# Patient Record
Sex: Female | Born: 1955 | Race: White | Hispanic: No | Marital: Single | Smoking: Former smoker
Health system: Southern US, Community
[De-identification: ages and names within clinical notes are randomized; demographics above are authoritative.]

## PROBLEM LIST (undated history)

## (undated) DIAGNOSIS — M722 Plantar fascial fibromatosis: Secondary | ICD-10-CM

## (undated) DIAGNOSIS — F329 Major depressive disorder, single episode, unspecified: Secondary | ICD-10-CM

## (undated) DIAGNOSIS — E7800 Pure hypercholesterolemia, unspecified: Secondary | ICD-10-CM

## (undated) DIAGNOSIS — M1990 Unspecified osteoarthritis, unspecified site: Secondary | ICD-10-CM

## (undated) DIAGNOSIS — F32A Depression, unspecified: Secondary | ICD-10-CM

## (undated) HISTORY — DX: Plantar fascial fibromatosis: M72.2

## (undated) HISTORY — DX: Unspecified osteoarthritis, unspecified site: M19.90

## (undated) HISTORY — DX: Depression, unspecified: F32.A

## (undated) HISTORY — DX: Major depressive disorder, single episode, unspecified: F32.9

## (undated) HISTORY — DX: Pure hypercholesterolemia, unspecified: E78.00

---

## 1995-04-09 HISTORY — PX: CERVICAL CONE BIOPSY: SUR198

## 2012-08-12 ENCOUNTER — Ambulatory Visit: Payer: No Typology Code available for payment source

## 2012-08-12 ENCOUNTER — Ambulatory Visit (INDEPENDENT_AMBULATORY_CARE_PROVIDER_SITE_OTHER): Payer: No Typology Code available for payment source | Admitting: Family Medicine

## 2012-08-12 VITALS — BP 132/86 | HR 86 | Temp 97.9°F | Resp 18 | Ht 67.5 in | Wt 183.2 lb

## 2012-08-12 DIAGNOSIS — R102 Pelvic and perineal pain: Secondary | ICD-10-CM

## 2012-08-12 DIAGNOSIS — K5900 Constipation, unspecified: Secondary | ICD-10-CM

## 2012-08-12 DIAGNOSIS — N949 Unspecified condition associated with female genital organs and menstrual cycle: Secondary | ICD-10-CM

## 2012-08-12 LAB — POCT URINALYSIS DIPSTICK
Bilirubin, UA: NEGATIVE
Blood, UA: NEGATIVE
Glucose, UA: NEGATIVE
Ketones, UA: NEGATIVE
Leukocytes, UA: NEGATIVE
Spec Grav, UA: 1.01
Urobilinogen, UA: 0.2
pH, UA: 5

## 2012-08-12 LAB — POCT UA - MICROSCOPIC ONLY
Casts, Ur, LPF, POC: NEGATIVE
Crystals, Ur, HPF, POC: NEGATIVE
Yeast, UA: NEGATIVE

## 2012-08-12 MED ORDER — CIPROFLOXACIN HCL 500 MG PO TABS: 500.0000 mg | ORAL_TABLET | Freq: Two times a day (BID) | ORAL | Status: DC

## 2012-08-12 MED ORDER — PHENAZOPYRIDINE HCL 200 MG PO TABS: 200.0000 mg | ORAL_TABLET | Freq: Three times a day (TID) | ORAL | Status: DC | PRN

## 2012-08-12 NOTE — Patient Instructions (Signed)
Get plenty of rest and drink at least 64 ounces of water daily. 

## 2012-08-12 NOTE — Progress Notes (Signed)
Subjective:    Patient ID: Lindsay Barnes, female    DOB: Sep 24, 1955, 57 y.o.   MRN: [id number]  HPI This 57 y.o. female presents for evaluation of "UTI."  Low pelvic pain x 27 hours.  Worse hotflashes than usual the last few weeks, especially bad last night.  Developed cramping, like she might have a period, but didn't bleed.  Thought perhaps it was gas, as she's had constipation x several weeks.  Pain got much worse over night, and used an OTC test for urinary tract infection, which was "very Very positive." Took an OTC azo to ease the pain, along with ibuprofen, which allowed her to sleep. UTI 11/2011 with similar symptoms, but with burning (that was the first UTI she'd had in >20 years).  Not sexually active x 3 weeks.  Small BM this morning. Has not had a colonoscopy.  [place] and has not established with PCP.  Her daughter recommended this office.  No increased urinary frequency. Some urgency last night.    Review of Systems No fever, chills, nausea, vomiting, HA, dizziness, blood in stool, melena,     Objective:   Physical Exam Blood pressure 132/86, pulse 86, temperature 97.9 F (36.6 C), temperature source Oral, resp. rate 18, height 5' 7.5" (1.715 m), weight 183 lb 3.2 oz (83.099 kg), SpO2 97.00%. Body mass index is 28.25 kg/(m^2). Well-developed, well nourished WF who is awake, alert and oriented, in NAD. HEENT: [place]/AT, sclera and conjunctiva are clear.   Neck: supple, non-tender, no lymphadenopathy, thyromegaly. Heart: RRR, no murmur Lungs: normal effort, CTA Abdomen: normo-active bowel sounds, supple, no mass or organomegaly. Tenderness in the suprapubic and LLQ. Extremities: no cyanosis, clubbing or edema. Skin: warm and dry without rash. Psychologic: good mood and appropriate affect, normal speech and behavior.  Acute Abdominal Series: [facility] reading (PRIMARY) by  Dr. Neva Seat.  Normal chest.  No free air.  Non-specific bowel gas pattern.  Results  for orders placed in visit on 08/12/12  POCT UA - MICROSCOPIC ONLY      Result Value Range   WBC, Ur, HPF, POC 1-2     RBC, urine, microscopic 0-1     Bacteria, U Microscopic small     Mucus, UA trace     Epithelial cells, urine per micros 1-3     Crystals, Ur, HPF, POC neg     Casts, Ur, LPF, POC neg     Yeast, UA neg    POCT URINALYSIS DIPSTICK      Result Value Range   Color, UA orange     Clarity, UA clear     Glucose, UA neg     Bilirubin, UA neg     Ketones, UA neg     Spec Grav, UA 1.010     Blood, UA neg     pH, UA 5.0     Protein, UA trace     Urobilinogen, UA 0.2     Nitrite, UA postive     Leukocytes, UA Negative        Assessment & Plan:  Pelvic pain in female - Plan: POCT UA - Microscopic Only, POCT urinalysis dipstick, Urine culture, DG Abd Acute W/Chest, ciprofloxacin (CIPRO) 500 MG tablet, phenazopyridine (PYRIDIUM) 200 MG tablet  Constipation - Plan: DG Abd Acute W/Chest - increase fluids, fiber and physical activity.  OTC stool softener prn.  Encouraged colonoscopy (I am happy to refer if required by her insurance).  RTC if symptoms worsen/persist.  Fernande Bras, PA-C Physician Assistant-Certified [facility]

## 2012-08-12 NOTE — Progress Notes (Signed)
Xray read and patient discussed with Ms. Leotis Shames, PA-C. Agree with assessment and plan of care per her note. Radiology report noted - acute abd series: IMPRESSION:  1. No acute cardiopulmonary process.  2. No bowel obstruction intraperitoneal free air.

## 2013-03-10 ENCOUNTER — Ambulatory Visit (INDEPENDENT_AMBULATORY_CARE_PROVIDER_SITE_OTHER): Payer: No Typology Code available for payment source | Admitting: Physician Assistant

## 2013-03-10 VITALS — BP 116/80 | HR 75 | Temp 97.4°F | Resp 16 | Ht 67.75 in | Wt 189.8 lb

## 2013-03-10 DIAGNOSIS — M79609 Pain in unspecified limb: Secondary | ICD-10-CM

## 2013-03-10 DIAGNOSIS — M549 Dorsalgia, unspecified: Secondary | ICD-10-CM

## 2013-03-10 DIAGNOSIS — L723 Sebaceous cyst: Secondary | ICD-10-CM

## 2013-03-10 DIAGNOSIS — L089 Local infection of the skin and subcutaneous tissue, unspecified: Secondary | ICD-10-CM

## 2013-03-10 MED ORDER — FLUCONAZOLE 150 MG PO TABS: 150.0000 mg | ORAL_TABLET | Freq: Once | ORAL | Status: DC

## 2013-03-10 MED ORDER — DOXYCYCLINE HYCLATE 100 MG PO CAPS: 100.0000 mg | ORAL_CAPSULE | Freq: Two times a day (BID) | ORAL | Status: DC

## 2013-03-10 NOTE — Progress Notes (Signed)
   Subjective:    Patient ID: Lindsay Barnes, female    DOB: 06/24/1955, 57 y.o.   MRN: [id number]  HPI   Lindsay Barnes is a very pleasant 57 yr old female here with concern for a knot on her back.  Reports having a spot on her back for years.  The spot was not raised but was always itchy.  About 10 days ago she noted a scab in that area.  There was a small amount of foul smelling drainage.  Now she has some pain in that spot.  Feels like a bruise.  No further drainage.  No fevers or chills.  Otherwise feels well.     Review of Systems  Constitutional: Negative for fever and chills.  Respiratory: Negative.   Cardiovascular: Negative.   Gastrointestinal: Negative.   Musculoskeletal: Negative.   Skin:       Knot on back       Objective:   Physical Exam  Vitals reviewed. Constitutional: She is oriented to person, place, and time. She appears well-developed and well-nourished. No distress.  HENT:  Head: Normocephalic and atraumatic.  Eyes: Conjunctivae are normal. No scleral icterus.  Neurological: She is alert and oriented to person, place, and time.  Skin: Skin is warm and dry.     Indurated, erythematous area about 2x3 cm at right upper back; minimal fluctuance; no warmth; appears c/w infected sebaceous cyst  Psychiatric: She has a normal mood and affect. Her behavior is normal.    Procedure Note: Verbal consent obtained from the patient.  Local anesthesia with 1cc 2% plain lidocaine.  Betadine prep.  Sterile technique.  Small incision with 11 blade.  Moderate purulence and scant sebaceous material were expressed.  Culture collected.  Irrigated with remaining anesthetic.  Packed with 1/4" plain packing.  Cleansed and dressed.      Assessment & Plan:  Infected sebaceous cyst - Plan: Wound culture, doxycycline (VIBRAMYCIN) 100 MG capsule   Lindsay Barnes is a very pleasant 57 yr old female here with infected sebaceous cyst at right upper back.  Procedure as above.  Culture collected.  Given  the drainage of purulent material, I have started her on doxycycline and packed the area today.  Expect the area to continue to drain.  Encouraged hot compresses.  Follow up in 72 hours for wound care, sooner if concerns.  Diflucan if needed for yeast infection   Meds ordered this encounter  Medications  . doxycycline (VIBRAMYCIN) 100 MG capsule    Sig: Take 1 capsule (100 mg total) by mouth 2 (two) times daily.    Dispense:  20 capsule    Refill:  0    Order Specific Question:  Supervising Provider    Answer:  Ethelda Chick [[id number]]  . fluconazole (DIFLUCAN) 150 MG tablet    Sig: Take 1 tablet (150 mg total) by mouth once. Repeat in a week if needed    Dispense:  2 tablet    Refill:  0    Order Specific Question:  Supervising Provider    Answer:  Ethelda Chick [[id number]]    Loleta Dicker MHS, PA-C [facility] 12/3/20142:52 PM

## 2013-03-10 NOTE — Patient Instructions (Signed)
Begin taking the antibiotics as directed.  Frequent hot compresses over the next 48 hours.  Change the dressing at least 1 time per day, more frequently if the dressing is getting wet or saturated.  Return on Saturday for wound care with Chelle, sooner if you have concerns   Epidermal Cyst An epidermal cyst is sometimes called a sebaceous cyst, epidermal inclusion cyst, or infundibular cyst. These cysts usually contain a substance that looks "pasty" or "cheesy" and may have a bad smell. This substance is a protein called keratin. Epidermal cysts are usually found on the face, neck, or trunk. They may also occur in the vaginal area or other parts of the genitalia of both men and women. Epidermal cysts are usually small, painless, slow-growing bumps or lumps that move freely under the skin. It is important not to try to pop them. This may cause an infection and lead to tenderness and swelling. CAUSES  Epidermal cysts may be caused by a deep penetrating injury to the skin or a plugged hair follicle, often associated with acne. SYMPTOMS  Epidermal cysts can become inflamed and cause:  Redness.  Tenderness.  Increased temperature of the skin over the bumps or lumps.  Grayish-white, bad smelling material that drains from the bump or lump. DIAGNOSIS  Epidermal cysts are easily diagnosed by your caregiver during an exam. Rarely, a tissue sample (biopsy) may be taken to rule out other conditions that may resemble epidermal cysts. TREATMENT   Epidermal cysts often get better and disappear on their own. They are rarely ever cancerous.  If a cyst becomes infected, it may become inflamed and tender. This may require opening and draining the cyst. Treatment with antibiotics may be necessary. When the infection is gone, the cyst may be removed with minor surgery.  Small, inflamed cysts can often be treated with antibiotics or by injecting steroid medicines.  Sometimes, epidermal cysts become large and  bothersome. If this happens, surgical removal in your caregiver's office may be necessary. HOME CARE INSTRUCTIONS  Only take over-the-counter or prescription medicines as directed by your caregiver.  Take your antibiotics as directed. Finish them even if you start to feel better. SEEK MEDICAL CARE IF:   Your cyst becomes tender, red, or swollen.  Your condition is not improving or is getting worse.  You have any other questions or concerns. MAKE SURE YOU:  Understand these instructions.  Will watch your condition.  Will get help right away if you are not doing well or get worse. Document Released: 02/24/2004 Document Revised: 06/17/2011 Document Reviewed: 10/01/2010 [facility] Patient Information 2014 [place], [place].

## 2013-03-13 ENCOUNTER — Ambulatory Visit (INDEPENDENT_AMBULATORY_CARE_PROVIDER_SITE_OTHER): Payer: No Typology Code available for payment source | Admitting: Physician Assistant

## 2013-03-13 VITALS — BP 104/70 | HR 76 | Temp 97.5°F | Resp 16 | Ht 67.5 in | Wt 189.0 lb

## 2013-03-13 DIAGNOSIS — L723 Sebaceous cyst: Secondary | ICD-10-CM

## 2013-03-13 DIAGNOSIS — L089 Local infection of the skin and subcutaneous tissue, unspecified: Secondary | ICD-10-CM

## 2013-03-13 LAB — WOUND CULTURE
Gram Stain: NONE SEEN
Gram Stain: NONE SEEN

## 2013-03-13 NOTE — Progress Notes (Signed)
   Subjective:    Patient ID: Lindsay Barnes, female    DOB: 06-Oct-1955, 57 y.o.   MRN: [id number]  Chief Complaint  Patient presents with  . Wound Care    Infected sebaceous cyst on right mid back    HPI Presents for wound care, s/p I&D of an infected sebaceous cyst on the back 3 days ago. That note is reviewed.  She is tolerating the doxycycline well. No pain at the site, minimal drainage. No fever, chills. Swelling much improved. She notes that the packing came out during a dressing change on the day after I&D.   Review of Systems     Objective:   Physical Exam BP 104/70  Pulse 76  Temp(Src) 97.5 F (36.4 C) (Oral)  Resp 16  Ht 5' 7.5" (1.715 m)  Wt 189 lb (85.73 kg)  BMI 29.15 kg/m2  SpO2 98% WDWNWF, A&O x 3. Bandage removed.  Well-healed surgical wound noted.  No erythema or edema.  Minimal induration. Non-fluctuant. Non-tender.       Assessment & Plan:  1. Infected sebaceous cyst Anticipatory guidance provided.  RTC PRN.   Fernande Bras, PA-C Physician Assistant-Certified [facility]

## 2013-07-15 ENCOUNTER — Ambulatory Visit (INDEPENDENT_AMBULATORY_CARE_PROVIDER_SITE_OTHER): Payer: No Typology Code available for payment source | Admitting: Physician Assistant

## 2013-07-15 VITALS — BP 110/70 | HR 90 | Temp 98.6°F | Resp 16 | Ht 68.5 in | Wt 187.0 lb

## 2013-07-15 DIAGNOSIS — J329 Chronic sinusitis, unspecified: Secondary | ICD-10-CM

## 2013-07-15 MED ORDER — AMOXICILLIN 875 MG PO TABS: 1750.0000 mg | ORAL_TABLET | Freq: Two times a day (BID) | ORAL | Status: DC

## 2013-07-15 MED ORDER — IPRATROPIUM BROMIDE 0.03 % NA SOLN: 2.0000 | Freq: Two times a day (BID) | NASAL | Status: DC

## 2013-07-15 MED ORDER — FLUTICASONE PROPIONATE 50 MCG/ACT NA SUSP: 2.0000 | Freq: Every day | NASAL | Status: DC

## 2013-07-15 NOTE — Progress Notes (Signed)
   Subjective:    Patient ID: Lindsay Barnes, female    DOB: 08/25/1955, 58 y.o.   MRN: [id number]  Sinus Problem Associated symptoms include congestion and a sore throat. Pertinent negatives include no chills or diaphoresis.  Sore Throat  Associated symptoms include congestion.    57y.o. Otherwise healthy female presents with 6 days of sinus pressure, post nasal drip, and sore throat.  She mentions that she has had these symptoms almost every year around this time.  She has bumped up her Vitamin C dose and has tried Copy[employer], advertising in past 2 days with no relief.  Pt feels like she has had associated generalized body aches, mild cough, decreased appetite, and fever but none measured.  Denies chills, nausea, vomiting, diarrhea, SOB, cough, abd pain.  No flu shot.  Review of Systems  Constitutional: Positive for fever (subjective; not measured). Negative for chills, diaphoresis, fatigue and unexpected weight change.  HENT: Positive for congestion and sore throat.   Respiratory: Negative.   Cardiovascular: Negative.   Gastrointestinal: Negative.   Endocrine: Negative.   Genitourinary: Negative.   Musculoskeletal: Positive for myalgias (generalized body aches).  Skin: Negative for rash.  Neurological: Negative.        Objective:   Physical Exam  Constitutional: She is oriented to person, place, and time. She appears well-developed and well-nourished. No distress.  HENT:  Head: Normocephalic.  Left Ear: External ear normal.  Mouth/Throat: No oropharyngeal exudate.  Mildly erythmatous retropharynx  Cardiovascular: Normal rate, regular rhythm, normal heart sounds and intact distal pulses.  Exam reveals no gallop and no friction rub.   No murmur heard. Pulmonary/Chest: Effort normal and breath sounds normal. No respiratory distress. She has no wheezes. She has no rales.  Abdominal: Soft. Bowel sounds are normal. There is no tenderness.  Lymphadenopathy:    Cervical adenopathy: tender in  tonsillar node region but no palpable lymphadenopathy.  Neurological: She is alert and oriented to person, place, and time. No cranial nerve deficit.  Skin: No rash noted.          Assessment & Plan:   1. Sinusitis Probable sinusitis from common cold.  Told pt to try Atrovent nasal spray and supportive care.  Pt will fill amoxicillin prescription if symptoms worsen in next 2-3 days.   - fluticasone (FLONASE) 50 MCG/ACT nasal spray; Place 2 sprays into both nostrils daily.  Dispense: 16 g; Refill: 12 - ipratropium (ATROVENT) 0.03 % nasal spray; Place 2 sprays into both nostrils 2 (two) times daily.  Dispense: 30 mL; Refill: 0 - amoxicillin (AMOXIL) 875 MG tablet; Take 2 tablets (1,750 mg total) by mouth 2 (two) times daily.  Dispense: 20 tablet; Refill: 0

## 2013-07-15 NOTE — Patient Instructions (Signed)
Get plenty of rest and drink at least 64oz of water each day.  You may not need the antibiotic, and we recommend waiting 2-3 days before filling it. Flonase is recommended for maintenance treatment of allergies rather than "as needed" acute treatment.  You will likely find better acute treatment with the ipratropium nasal spray.  Using flonase daily during your allergy season may reduce your daily symptoms and reduce the likelihood of developing a sinus infection.

## 2013-07-15 NOTE — Progress Notes (Signed)
I have examined this patient along with the student and agree.  

## 2013-09-30 ENCOUNTER — Ambulatory Visit (INDEPENDENT_AMBULATORY_CARE_PROVIDER_SITE_OTHER): Payer: No Typology Code available for payment source | Admitting: Physician Assistant

## 2013-09-30 VITALS — BP 136/88 | HR 88 | Temp 98.1°F | Resp 16 | Ht 67.5 in | Wt 177.4 lb

## 2013-09-30 DIAGNOSIS — G500 Trigeminal neuralgia: Secondary | ICD-10-CM

## 2013-09-30 DIAGNOSIS — M722 Plantar fascial fibromatosis: Secondary | ICD-10-CM | POA: Insufficient documentation

## 2013-09-30 MED ORDER — BACLOFEN 20 MG PO TABS: 10.0000 mg | ORAL_TABLET | Freq: Three times a day (TID) | ORAL | Status: DC

## 2013-09-30 NOTE — Progress Notes (Signed)
Subjective:    Patient ID: Lindsay Barnes, female    DOB: [birth year], 58 y.o.   MRN: [id number]   PCP: No PCP Per Patient  Chief Complaint  Patient presents with  . Follow-up    wanted to discuss with Chelle about her pain in her left facial/jaw pain.   also having a spasm in her right thigh.      Medications, allergies, past medical history, surgical history, family history, social history and problem list reviewed and updated.  There are no active problems to display for this patient.   Prior to Admission medications   Medication Sig Start Date End Date Taking? Authorizing Provider  cholecalciferol (VITAMIN D) 1000 UNITS tablet Take 1,000 Units by mouth daily.   Yes Historical Provider, MD  fish oil-omega-3 fatty acids 1000 MG capsule Take 2 g by mouth daily.   Yes Historical Provider, MD  Multiple Vitamins-Minerals (MULTIVITAMIN WITH MINERALS) tablet Take 1 tablet by mouth daily.   Yes Historical Provider, MD  Specialty Vitamins Products (MAGNESIUM, AMINO ACID CHELATE,) 133 MG tablet Take 1 tablet by mouth 2 (two) times daily.   Yes Historical Provider, MD    HPI  I believe I have trigeminal neuralgia.  Started about 5 years ago.  Washed her face one morning and developed spasm on the LEFT side of her face.  Saw her PCP, who considered referring her to neurology, but since the patient didn't want to take regular medications, they decided to watch it.  After 9 months, her symptoms resolved.  The symptoms recurred when she moved here almost 2 years ago.  Only with touching the LEFT side of her face.  Seemed worse than initially, but then resolved after about 3 months.  6 months later, in 11/2012, it recurred.  More intense since 03/2013, and then worse again when she had a sinusitis in 07/2013.  Attacks are severe, and then resolve completely.  For the past 4 weeks, she's had daily symptoms.   Symptoms with brushing teeth, problems chewing. Does some mediation to relax when symptoms  occur.   Also has spasms in the RIGHT thigh intermittently, since 03/2013.  No HA, dizziness, confusion or weakness. No skin lesions. No vision change.  Review of Systems     Objective:   Physical Exam  Constitutional: She is oriented to person, place, and time. She appears well-developed and well-nourished. She is active and cooperative. No distress.  BP 136/88  Pulse 88  Temp(Src) 98.1 F (36.7 C) (Oral)  Resp 16  Ht 5' 7.5" (1.715 m)  Wt 177 lb 6.4 oz (80.468 kg)  BMI 27.36 kg/m2  SpO2 98%   HENT:  Head: Normocephalic and atraumatic.  Right Ear: External ear normal.  Left Ear: External ear normal.  Nose: Nose normal.  Mouth/Throat: Oropharynx is clear and moist. No oropharyngeal exudate.  Eyes: Conjunctivae and EOM are normal. Pupils are equal, round, and reactive to light. No scleral icterus.  Neck: Neck supple. No thyromegaly present.  Cardiovascular: Normal rate, regular rhythm and normal heart sounds.   Pulmonary/Chest: Effort normal and breath sounds normal.  Lymphadenopathy:    She has no cervical adenopathy.  Neurological: She is alert and oriented to person, place, and time. She has normal strength. No cranial nerve deficit or sensory deficit.  Skin: Skin is warm and dry.  Psychiatric: She has a normal mood and affect. Her behavior is normal.          Assessment & Plan:  1. Trigeminal neuralgia  of left side of face Recommend referral to neurology, but she prefers to try Baclofen first.  We reviewed the potential adverse effects, and she'll start with 10 mg at HS, increasing as tolerated up to 20 mg TID. If she decides she's ready to go to neurology, she'll let me know. - baclofen (LIORESAL) 20 MG tablet; Take 0.5-1 tablets (10-20 mg total) by mouth 3 (three) times daily.  Dispense: 90 each; Refill: 0   Fara Chute, PA-C Physician Assistant-Certified [facility]

## 2013-09-30 NOTE — Patient Instructions (Signed)
Let me know if the symptoms worsen or persist, so I can refer you to a neurologist.

## 2014-02-13 ENCOUNTER — Ambulatory Visit (INDEPENDENT_AMBULATORY_CARE_PROVIDER_SITE_OTHER): Payer: No Typology Code available for payment source | Admitting: Emergency Medicine

## 2014-02-13 VITALS — BP 124/80 | HR 81 | Temp 97.9°F | Resp 16 | Ht 67.25 in | Wt 182.2 lb

## 2014-02-13 DIAGNOSIS — M5489 Other dorsalgia: Secondary | ICD-10-CM

## 2014-02-13 DIAGNOSIS — L03319 Cellulitis of trunk, unspecified: Secondary | ICD-10-CM

## 2014-02-13 DIAGNOSIS — L02219 Cutaneous abscess of trunk, unspecified: Secondary | ICD-10-CM

## 2014-02-13 MED ORDER — CEPHALEXIN 500 MG PO CAPS: 500.0000 mg | ORAL_CAPSULE | Freq: Four times a day (QID) | ORAL | Status: DC

## 2014-02-13 NOTE — Progress Notes (Signed)
[facility] [address] [place] [address] [phone]  Date:  02/13/2014   Name:  Lindsay Barnes   DOB:  03-May-1955   MRN:  [id number]  PCP:  No PCP Per Patient    Chief Complaint: Cyst   History of Present Illness:  Lindsay Barnes is a 58 y.o. very pleasant female patient who presents with the following:  Recurrent abscessed sebaceous cyst on right upper back. No fever or chills Now has recurrent and tender. No improvement with over the counter medications or other home remedies. Denies other complaint or health concern today.   Patient Active Problem List   Diagnosis Date Noted  . Plantar fasciitis, bilateral     Past Medical History  Diagnosis Date  . Plantar fasciitis, bilateral     Past Surgical History  Procedure Laterality Date  . Cesarean section      History  Substance Use Topics  . Smoking status: Former Smoker -- 1.00 packs/day for 30 years    Quit date: 04/16/2012  . Smokeless tobacco: Never Used     Comment: vapes  . Alcohol Use: 1.2 oz/week    2 Glasses of wine per week    Family History  Problem Relation Age of Onset  . Arthritis Mother   . Osteopenia Mother   . Heart disease Father     s/p 4V CABG  . Heart disease Maternal Grandmother     No Known Allergies  Medication list has been reviewed and updated.  Current Outpatient Prescriptions on File Prior to Visit  Medication Sig Dispense Refill  . baclofen (LIORESAL) 20 MG tablet Take 0.5-1 tablets (10-20 mg total) by mouth 3 (three) times daily. 90 each 0  . cholecalciferol (VITAMIN D) 1000 UNITS tablet Take 1,000 Units by mouth daily.    . fish oil-omega-3 fatty acids 1000 MG capsule Take 2 g by mouth daily.    . Multiple Vitamins-Minerals (MULTIVITAMIN WITH MINERALS) tablet Take 1 tablet by mouth daily.    Marland Kitchen Specialty Vitamins Products (MAGNESIUM, AMINO ACID CHELATE,) 133 MG tablet Take 1 tablet by mouth 2 (two) times daily.     No current facility-administered  medications on file prior to visit.    Review of Systems:  As per HPI, otherwise negative.    Physical Examination: Filed Vitals:   02/13/14 1400  BP: 124/80  Pulse: 81  Temp: 97.9 F (36.6 C)  Resp: 16   Filed Vitals:   02/13/14 1400  Height: 5' 7.25" (1.708 m)  Weight: 182 lb 3.2 oz (82.645 kg)   Body mass index is 28.33 kg/(m^2). Ideal Body Weight: Weight in (lb) to have BMI = 25: 160.5   GEN: WDWN, NAD, Non-toxic, Alert & Oriented x 3 HEENT: Atraumatic, Normocephalic.  Ears and Nose: No external deformity. EXTR: No clubbing/cyanosis/edema NEURO: Normal gait.  PSYCH: Normally interactive. Conversant. Not depressed or anxious appearing.  Calm demeanor.  SKIN:  Abscess right upper back   Assessment and Plan: Abscess I&D Keflex based on last culture  Signed,  Ellison Carwin, MD

## 2014-02-13 NOTE — Progress Notes (Signed)
Procedure: Verbal consent obtained. Skin was cleaned with alcohol. Skin was anesthetized with 3 cc 2% lido with epi. Skin was then cleaned again with betadine. A vertical 2 cm incision was made through the pore. A large amount of purulent material was expressed - no sebaceous material expressed. Cyst capsule was not identified. Pore tract was removed. Dark brown spongy material removed from base of wound cavity. Wound was aggressively packed with 1/4 inch packing. Dressing placed. Wound care discussed. She will return in 2 days for wound check.

## 2014-02-13 NOTE — Progress Notes (Signed)
I actively participated during the procedure.

## 2014-02-13 NOTE — Patient Instructions (Signed)
Dressing Change °A dressing is a material placed over wounds. It keeps the wound clean, dry, and protected from further injury. This provides an environment that favors wound healing.  °BEFORE YOU BEGIN °· Get your supplies together. Things you may need include: °¨ Saline solution. °¨ Flexible gauze dressing. °¨ Medicated cream. °¨ Tape. °¨ Gloves. °¨ Abdominal dressing pads. °¨ Gauze squares. °¨ Plastic bags. °· Take pain medicine 30 minutes before the dressing change if you need it. °· Take a shower before you do the first dressing change of the day. Use plastic wrap or a plastic bag to prevent the dressing from getting wet. °REMOVING YOUR OLD DRESSING  °· Wash your hands with soap and water. Dry your hands with a clean towel. °· Put on your gloves. °· Remove any tape. °· Carefully remove the old dressing. If the dressing sticks, you may dampen it with warm water to loosen it, or follow your caregiver's specific directions. °· Remove any gauze or packing tape that is in your wound. °· Take off your gloves. °· Put the gloves, tape, gauze, or any packing tape into a plastic bag. °CHANGING YOUR DRESSING °· Open the supplies. °· Take the cap off the saline solution. °· Open the gauze package so that the gauze remains on the inside of the package. °· Put on your gloves. °· Clean your wound as told by your caregiver. °· If you have been told to keep your wound dry, follow those instructions. °· Your caregiver may tell you to do one or more of the following: °¨ Pick up the gauze. Pour the saline solution over the gauze. Squeeze out the extra saline solution. °¨ Put medicated cream or other medicine on your wound if you have been told to do so. °¨ Put the solution soaked gauze only in your wound, not on the skin around it. °¨ Pack your wound loosely or as told by your caregiver. °¨ Put dry gauze on your wound. °¨ Put abdominal dressing pads over the dry gauze if your wet gauze soaks through. °· Tape the abdominal dressing  pads in place so they will not fall off. Do not wrap the tape completely around the affected part (arm, leg, abdomen). °· Wrap the dressing pads with a flexible gauze dressing to secure it in place. °· Take off your gloves. Put them in the plastic bag with the old dressing. Tie the bag shut and throw it away. °· Keep the dressing clean and dry until your next dressing change. °· Wash your hands. °SEEK MEDICAL CARE IF: °· Your skin around the wound looks red. °· Your wound feels more tender or sore. °· You see pus in the wound. °· Your wound smells bad. °· You have a fever. °· Your skin around the wound has a rash that itches and burns. °· You see black or yellow skin in your wound that was not there before. °· You feel nauseous, throw up, and feel very tired. °Document Released: 05/02/2004 Document Revised: 06/17/2011 Document Reviewed: 02/04/2011 °ExitCare® Patient Information ©2015 [employer]. This information is not intended to replace advice given to you by your health care provider. Make sure you discuss any questions you have with your health care provider. ° °

## 2014-02-15 ENCOUNTER — Ambulatory Visit (INDEPENDENT_AMBULATORY_CARE_PROVIDER_SITE_OTHER): Payer: No Typology Code available for payment source | Admitting: Physician Assistant

## 2014-02-15 VITALS — BP 118/82 | HR 94 | Temp 98.2°F | Resp 16 | Wt 183.0 lb

## 2014-02-15 DIAGNOSIS — L03319 Cellulitis of trunk, unspecified: Secondary | ICD-10-CM

## 2014-02-15 DIAGNOSIS — L02219 Cutaneous abscess of trunk, unspecified: Secondary | ICD-10-CM

## 2014-02-15 NOTE — Progress Notes (Signed)
   Subjective:    Patient ID: Lindsay Barnes, female    DOB: 02/12/1956, 58 y.o.   MRN: [id number]  HPI Pt presents for a recheck.  She is doing better. Still having some pain.  Started her abx this am and not having any problems with it. She has had some bloody drainage from the area.  Review of Systems  Constitutional: Negative for fever and chills.  Skin: Positive for wound.       Objective:   Physical Exam  Constitutional: She is oriented to person, place, and time. She appears well-developed and well-nourished.  BP 118/82 mmHg  Pulse 94  Temp(Src) 98.2 F (36.8 C)  Resp 16  Wt 183 lb (83.008 kg)  SpO2 99%   Neurological: She is alert and oriented to person, place, and time.  Skin: Skin is warm and dry.  Drsg and packing removed.  No purulence expressed.  Wound cavity appears with granulation tissue except at superior aspect where there is hard tissue almost like scar tissue that is slightly debrided. Area irrigated and repacked with 1/4in plain packing.  Drsg placed.  Psychiatric: She has a normal mood and affect. Her behavior is normal. Judgment and thought content normal.  Vitals reviewed.      Assessment & Plan:  Cellulitis and abscess of trunk   Continue abx.  Change drsg daily or sooner if soaked.  F/u in [place] PA-C  [facility] 02/15/2014 7:03 PM

## 2014-02-17 ENCOUNTER — Ambulatory Visit (INDEPENDENT_AMBULATORY_CARE_PROVIDER_SITE_OTHER): Payer: No Typology Code available for payment source | Admitting: Physician Assistant

## 2014-02-17 VITALS — BP 116/74 | HR 83 | Temp 98.0°F | Resp 20 | Wt 183.0 lb

## 2014-02-17 DIAGNOSIS — L03319 Cellulitis of trunk, unspecified: Secondary | ICD-10-CM

## 2014-02-17 DIAGNOSIS — L02219 Cutaneous abscess of trunk, unspecified: Secondary | ICD-10-CM

## 2014-02-17 NOTE — Progress Notes (Signed)
   Subjective:    Patient ID: Lindsay Barnes, female    DOB: Oct 25, 1955, 58 y.o.   MRN: [id number]  HPI Pt presents to clinic for wound recheck.  She feels like the area is better.  She is tolerating the abx ok.  She is changing the drsg daily.  Review of Systems  Constitutional: Negative for fever and chills.  Skin: Positive for wound.       Objective:   Physical Exam  Constitutional: She is oriented to person, place, and time. She appears well-developed and well-nourished.  BP 116/74 mmHg  Pulse 83  Temp(Src) 98 F (36.7 C) (Oral)  Resp 20  Wt 183 lb (83.008 kg)  SpO2 97%   HENT:  Head: Normocephalic and atraumatic.  Right Ear: External ear normal.  Left Ear: External ear normal.  Pulmonary/Chest: Effort normal.  Neurological: She is alert and oriented to person, place, and time.  Skin: Skin is warm and dry.  Drsg and packing removed.  Wound cavity has good granulation tissue.  No more spongy material is present in the wound cavity.  Induration is almost resolved and there is no erythema.    Psychiatric: She has a normal mood and affect. Her behavior is normal. Judgment and thought content normal.  Vitals reviewed.      Assessment & Plan:  Cellulitis and abscess of trunk  Finish abx.  Change drsg daily until the wound is healed in.  No packing placed today.  Windell Hummingbird PA-C  [facility] 02/17/2014 9:03 PM

## 2014-08-27 ENCOUNTER — Ambulatory Visit (INDEPENDENT_AMBULATORY_CARE_PROVIDER_SITE_OTHER): Payer: 59 | Admitting: Physician Assistant

## 2014-08-27 VITALS — BP 120/78 | HR 76 | Temp 98.2°F | Ht 67.25 in | Wt 188.1 lb

## 2014-08-27 DIAGNOSIS — L03312 Cellulitis of back [any part except buttock]: Secondary | ICD-10-CM

## 2014-08-27 MED ORDER — FLUCONAZOLE 150 MG PO TABS: 150.0000 mg | ORAL_TABLET | Freq: Once | ORAL | Status: DC

## 2014-08-27 MED ORDER — CEPHALEXIN 500 MG PO CAPS
500.0000 mg | ORAL_CAPSULE | Freq: Three times a day (TID) | ORAL | Status: AC
Start: 2014-08-27 — End: 2014-09-06

## 2014-08-27 NOTE — Progress Notes (Signed)
   Subjective:    Patient ID: Lindsay Barnes, female    DOB: 1955/10/22, 59 y.o.   MRN: [id number]  HPI The patient presents to clinic with an area on her back in the exact spot where she had an I&D about 6 months ago.  She noticed a swollen area that had a slight odor a few weeks ago and now it is getting red and slightly painful.  She would like to try abx 1st if that is a possibility.  Patient Active Problem List   Diagnosis Date Noted  . Plantar fasciitis, bilateral    The patient has a current medication list which includes the following prescription(s): cholecalciferol, fish oil-omega-3 fatty acids, multivitamin with minerals, magnesium (amino acid chelate), cephalexin, and fluconazole. No Known Allergies  Medications, allergies, past medical history, surgical history, family history, social history and problem list reviewed and updated.   Review of Systems  Constitutional: Negative for fever and chills.  Skin: Positive for wound.       Objective:   Physical Exam  Constitutional: She is oriented to person, place, and time. She appears well-developed and well-nourished.  BP 120/78 mmHg  Pulse 76  Temp(Src) 98.2 F (36.8 C) (Oral)  Ht 5' 7.25" (1.708 m)  Wt 188 lb 2 oz (85.333 kg)  BMI 29.25 kg/m2  SpO2 98%   HENT:  Head: Normocephalic and atraumatic.  Right Ear: External ear normal.  Left Ear: External ear normal.  Eyes: Conjunctivae are normal.  Pulmonary/Chest: Effort normal.  Neurological: She is alert and oriented to person, place, and time.  Skin: Skin is warm and dry. There is erythema (mid back erythema with central mild induration with a central pore.  Nothing expressed out of the area.  No fluctuence noted on exam. slight TTP).  Psychiatric: She has a normal mood and affect. Her behavior is normal. Judgment and thought content normal.       Assessment & Plan:  Cellulitis of back - Plan: cephALEXin (KEFLEX) 500 MG capsule, fluconazole (DIFLUCAN) 150 MG tablet     We will start abx and she will do warm compresses and if the area does not improve she will return to clinic for an I&D.  Windell Hummingbird PA-C  [facility] 08/28/2014 8:07 AM

## 2014-08-27 NOTE — Patient Instructions (Signed)
Warm compresses.

## 2014-09-18 ENCOUNTER — Ambulatory Visit (INDEPENDENT_AMBULATORY_CARE_PROVIDER_SITE_OTHER): Payer: 59 | Admitting: Physician Assistant

## 2014-09-18 DIAGNOSIS — L03319 Cellulitis of trunk, unspecified: Secondary | ICD-10-CM | POA: Diagnosis not present

## 2014-09-18 DIAGNOSIS — L02219 Cutaneous abscess of trunk, unspecified: Secondary | ICD-10-CM

## 2014-09-18 MED ORDER — SULFAMETHOXAZOLE-TRIMETHOPRIM 800-160 MG PO TABS: 1.0000 | ORAL_TABLET | Freq: Two times a day (BID) | ORAL | Status: AC

## 2014-09-18 NOTE — Progress Notes (Signed)
Patient ID: Lindsay Barnes, female    DOB: 1955-09-16, 59 y.o.   MRN: [id number]  PCP: No PCP Per Patient   Subjective:  HPI Pt is a 59 y/o female that presents to clinic for evaluation of a recurrent abscess.   Pt has been seen 3 times (this being her 4th) since December 2014 for a recurrent abscess in the middle of her upper back. It was last drained on 02/15/2014, cultures grew out staph aureus at this time, and it recurred in May of 2016. When seen on 08/27/2014, pt refused drainage and elected to use antibiotics alone for treatment.  Pt completed her 10-day course of keflex on 09/06/14, which she tolerated well. The Keflex did not result in much improvement, however, the abscess grew significantly after she completed the antibiotic course.  Pt states that the abscess is not very tender, but is occasionally itchy, and is currently not draining. Pt denies any fever/chills or nausea/vomiting.    Review of Systems  Constitutional: Positive for fatigue. Negative for fever, chills and appetite change.  HENT: Positive for sneezing (Associated with dust/dirt and fumes at her new job). Negative for congestion, ear discharge, postnasal drip, rhinorrhea, sinus pressure and sore throat.   Eyes: Negative for photophobia, discharge, redness and itching.  Respiratory: Positive for cough (Associated with dust/dirt and fumes at her new job). Negative for choking, chest tightness, shortness of breath, wheezing and stridor.   Cardiovascular: Negative.   Gastrointestinal: Negative for nausea, vomiting, diarrhea and constipation.  Musculoskeletal: Negative for myalgias and arthralgias.  Skin: Positive for wound (Abscess on upper back). Negative for rash.  Allergic/Immunologic: Negative for environmental allergies and food allergies.  Hematological: Negative for adenopathy.  Psychiatric/Behavioral: Positive for suicidal ideas (thoughts, but no plan), dysphoric mood and decreased concentration. Negative for sleep  disturbance, self-injury and agitation. The patient is not nervous/anxious and is not hyperactive.      Patient Active Problem List   Diagnosis Date Noted  . Plantar fasciitis, bilateral     Past Medical History  Diagnosis Date  . Plantar fasciitis, bilateral     Prior to Admission medications   Medication Sig Start Date End Date Taking? Authorizing Provider  cholecalciferol (VITAMIN D) 1000 UNITS tablet Take 1,000 Units by mouth daily.   Yes Historical Provider, MD  fish oil-omega-3 fatty acids 1000 MG capsule Take 2 g by mouth daily.   Yes Historical Provider, MD  Multiple Vitamins-Minerals (MULTIVITAMIN WITH MINERALS) tablet Take 1 tablet by mouth daily.   Yes Historical Provider, MD  Specialty Vitamins Products (MAGNESIUM, AMINO ACID CHELATE,) 133 MG tablet Take 1 tablet by mouth 2 (two) times daily.   Yes Historical Provider, MD  fluconazole (DIFLUCAN) 150 MG tablet Take 1 tablet (150 mg total) by mouth once. Repeat in 1 week is needed Patient not taking: Reported on 09/18/2014 08/27/14   Mancel Bale, PA-C    No Known Allergies  Past Medical, Surgical Family and Social History reviewed and updated.   Objective:   Vitals: There were no vitals taken for this visit.   Physical Exam  Constitutional: She is oriented to person, place, and time. She appears well-developed and well-nourished. She is cooperative. No distress.  HENT:  Head: Normocephalic and atraumatic.  Right Ear: External ear normal.  Left Ear: External ear normal.  Nose: Nose normal. Right sinus exhibits no maxillary sinus tenderness and no frontal sinus tenderness. Left sinus exhibits no maxillary sinus tenderness and no frontal sinus tenderness.  Mouth/Throat: Uvula is  midline, oropharynx is clear and moist and mucous membranes are normal.  Eyes: Conjunctivae and lids are normal. Pupils are equal, round, and reactive to light. Right eye exhibits no discharge. Left eye exhibits no discharge.  Cardiovascular:  Normal rate, regular rhythm and normal heart sounds.  Exam reveals no gallop and no friction rub.   No murmur heard. Pulmonary/Chest: Effort normal and breath sounds normal. No respiratory distress. She has no wheezes. She has no rales.  Abdominal: Normal appearance.  Lymphadenopathy:       Head (right side): No submental, no submandibular, no tonsillar, no preauricular, no posterior auricular and no occipital adenopathy present.       Head (left side): No submental, no submandibular, no tonsillar, no preauricular, no posterior auricular and no occipital adenopathy present.    She has no cervical adenopathy.  Neurological: She is alert and oriented to person, place, and time.  Skin: Skin is warm, dry and intact. Lesion (1cm x 1cm closed abscess in the middle of upper back) noted. No abrasion and no rash noted. There is erythema (mild erythema directly around lesion).     Psychiatric: Her mood appears not anxious. Her affect is blunt. Her affect is not angry and not inappropriate. She exhibits a depressed mood.  Pt tearful throughout interaction.   Procedure: -  Consent obtained. -  Alcohol prep with 7 cc of lidocaine with epi injected into and around wound for local anesthesia. -  Betadine prep to skin. -  Began with 2cm incision over scar of previous I&D.  -  Copious purulent drainage initially, necrotic tissue visualized at the base of the cavity. -  3cm x 1cm elliptical incision made surrounding the initial incision with a scalpel for exploration and debridement of the area. -  Curette used to debride additional necrotic tissue at the base of the cavity. -  Wound irrigated with 36mL of NS. -  1 deep dermal suture placed, using 4-0 braided vicryl suture material, in the middle of the wound, where the wound was deepest. -  5 vertical mattress sutures placed, using 4-0 monocryl suture material.  -  Dressing applied to cover area.   Assessment & Plan:   Lindsay Barnes was seen today for  evaluation of recurrent skin abscess on her back.  Diagnoses and all orders for this visit:  Cellulitis and abscess of trunk -     Incision and debridement performed to drain abscess and remove necrotic tissue. -     3cm x 1cm wound sutured deeply and superficially try to prevent recurrence of abscess. -     Dressing applied to wound. -    Pt to finish 10 day course of antibiotics and return to clinic for re-evaluation and suture removal in 10 days. -     Return sooner if symptoms worsen. Orders: -     sulfamethoxazole-trimethoprim (BACTRIM DS,SEPTRA DS) 800-160 MG per tablet; Take 1 tablet by mouth 2 (two) times daily.  Evona Westra, PA-S [facility] 09/18/2014 3:41 PM

## 2014-09-18 NOTE — Progress Notes (Signed)
Lindsay Barnes  MRN: [id number] DOB: 12/05/1955  Subjective:  Pt presents to clinic with the area on her back getting bigger and more painful over the last several days since she stopped her abx that was started on 5/21.  She has been seen 3 times (this being her 4th) since December 2014 for a recurrent abscess in the middle of her upper back. It was last drained on 02/15/2014, cultures grew out staph aureus at this time, and it recurred in May of 2016. When seen on 08/27/2014, pt refused drainage and elected to use antibiotics alone for treatment. Pt completed her 10-day course of keflex on 09/06/14, which she tolerated well. The Keflex did not result in much improvement, however, the abscess grew significantly after she completed the antibiotic course. Pt states that the abscess is not very tender, but is occasionally itchy, and is currently not draining. Pt denies any fever/chills or nausea/vomiting.   Pt had a positive PHQ9.  She has h/o depressive episodes multiple times in the - she has had medications in the past but she has never really felt like they worked or they gave her SE that she was not willing to tolerate.  She seems to have these depression episodes associated with stressful times in her life.  She is current living with her daughter, granddaughter and her daughters boyfriend.  She feels like she is treated like a child.  She does not like her boss.  She cannot afford to live by herself.  She thinks her health insurance is not active.  She cries all the time and thinks of suicide but has no plans or intent.  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  She has seen therapy in the past but does not want it currently.  She feels like it has helped in the past though.  She feels like her depressed mood will resolve if she can get her hormones  under control.  Patient Active Problem List   Diagnosis Date Noted  . Plantar fasciitis, bilateral     Current Outpatient Prescriptions on File Prior to Visit  Medication Sig Dispense Refill  . cholecalciferol (VITAMIN D) 1000 UNITS tablet Take 1,000 Units by mouth daily.    . fish oil-omega-3 fatty acids 1000 MG capsule Take 2 g by mouth daily.    . Multiple Vitamins-Minerals (MULTIVITAMIN WITH MINERALS) tablet Take 1 tablet by mouth daily.    Marland Kitchen Specialty Vitamins Products (MAGNESIUM, AMINO ACID CHELATE,) 133 MG tablet Take 1 tablet by mouth 2 (two) times daily.    . fluconazole (DIFLUCAN) 150 MG tablet Take 1 tablet (150 mg total) by mouth once. Repeat in 1 week is needed (Patient not taking: Reported on 09/18/2014) 2 tablet 0   No current facility-administered medications on file prior to visit.    No Known Allergies  Review of Systems  Constitutional: Negative for fever and chills.  Skin: Positive for wound.  Psychiatric/Behavioral: Positive for suicidal ideas (no plans on intent) and dysphoric mood. Negative for sleep disturbance.   Objective:  There were no vitals taken for this visit.  Physical Exam  Constitutional: She is oriented to person, place, and time and well-developed, well-nourished, and in no distress.  HENT:  Head: Normocephalic and atraumatic.  Right Ear: Hearing and external ear normal.  Left Ear: Hearing and external  ear normal.  Eyes: Conjunctivae are normal.  Neck: Normal range of motion.  Pulmonary/Chest: Effort normal.  Neurological: She is alert and oriented to person, place, and time. Gait normal.  Skin: Skin is warm and dry.  2 cm abscess on right upper back - procedure completed to remove the pus and necrotic tissue present at the base of the wound cavity.  Psychiatric: Memory, affect and judgment normal.  Tearful when talking about her mood  Vitals reviewed.  Procedure:  I actively participated during the procedure and was present during the  entire procedure done by [employer] PA-S. Assessment and Plan :  Cellulitis and abscess of trunk - Plan: sulfamethoxazole-trimethoprim (BACTRIM DS,SEPTRA DS) 800-160 MG per tablet - this infection is in the same location as the abscess that I I&D'd in 02/2015.  There was the same necrotic type tissue present - unsure of the exact cause but feel like this tissue is the cause of her recurrent infection.  Hopefully the debrided area will heal and she will not have problems with this in the future.  Wound care was d/w pt.  She will RTC in 11 days for suture removal or sooner if she is having problems.  Depression - not interested in treatment at this time - she has no suicide plans or intent and plans to let me know if this changes.    She plans to get in touch with integrative medication specialist for hormone replacement and would like me to do a pap smear for her.    Lindsay Hummingbird PA-C  [facility] 09/18/2014 3:55 PM

## 2014-09-18 NOTE — Patient Instructions (Addendum)
WOUND CARE Please return in 10 (6/23 at 8am) days to have your stitches/staples removed or sooner if you have concerns. Marland Kitchen Keep area clean and dry for 24 hours. Do not remove bandage, if applied. . After 24 hours, remove bandage and wash wound gently with mild soap and warm water. Reapply a new bandage after cleaning wound, if directed. . Continue daily cleansing with soap and water until stitches/staples are removed. . Do not apply any ointments or creams to the wound while stitches/staples are in place, as this may cause delayed healing. . Notify the office if you experience any of the following signs of infection: Swelling, redness, pus drainage, streaking, fever >101.0 F . Notify the office if you experience excessive bleeding that does not stop after 15-20 minutes of constant, firm pressure.

## 2014-09-29 ENCOUNTER — Telehealth: Payer: Self-pay

## 2014-09-29 ENCOUNTER — Ambulatory Visit (INDEPENDENT_AMBULATORY_CARE_PROVIDER_SITE_OTHER): Payer: 59 | Admitting: Physician Assistant

## 2014-09-29 VITALS — BP 122/78 | HR 79 | Temp 98.2°F | Resp 17 | Ht 68.5 in | Wt 188.0 lb

## 2014-09-29 DIAGNOSIS — L089 Local infection of the skin and subcutaneous tissue, unspecified: Secondary | ICD-10-CM

## 2014-09-29 DIAGNOSIS — L723 Sebaceous cyst: Secondary | ICD-10-CM

## 2014-09-29 DIAGNOSIS — L989 Disorder of the skin and subcutaneous tissue, unspecified: Secondary | ICD-10-CM

## 2014-09-29 NOTE — Telephone Encounter (Signed)
Lindsay Barnes, pt has scheduled a CPE with you for August and she is using [place] as her PCP. Her insurance [facility]) requires a referrals for EVERYTHING. She would like to have a screening dermatology visit as she has had problems in the past. Would you be willing to put in a referral for her so we can schedule this?

## 2014-09-29 NOTE — Progress Notes (Signed)
[facility] [place], [place] [address] [phone]  Date:  09/29/2014   Name:  Lindsay Barnes   DOB:  08-01-1955   MRN:  [id number]  PCP:  No PCP Per Patient    Chief Complaint: Suture / Staple Removal   History of Present Illness:  This is a 59 y.o. female who is presenting for suture removal of an incision on her back. She underwent I&D of a recurrent abscess on her back on 09/18/14. #5 horizontal sutures placed. She reports the area is doing well. No drainage, fever or chills.  Review of Systems:  Review of Systems See HPI  Patient Active Problem List   Diagnosis Date Noted  . Plantar fasciitis, bilateral     Prior to Admission medications   Medication Sig Start Date End Date Taking? Authorizing Provider  cholecalciferol (VITAMIN D) 1000 UNITS tablet Take 1,000 Units by mouth daily.   Yes Historical Provider, MD  fish oil-omega-3 fatty acids 1000 MG capsule Take 2 g by mouth daily.   Yes Historical Provider, MD  Multiple Vitamins-Minerals (MULTIVITAMIN WITH MINERALS) tablet Take 1 tablet by mouth daily.   Yes Historical Provider, MD  Specialty Vitamins Products (MAGNESIUM, AMINO ACID CHELATE,) 133 MG tablet Take 1 tablet by mouth 2 (two) times daily.   Yes Historical Provider, MD    No Known Allergies  Past Surgical History  Procedure Laterality Date  . Cesarean section      History  Substance Use Topics  . Smoking status: Former Smoker -- 1.00 packs/day for 30 years    Quit date: 04/16/2012  . Smokeless tobacco: Never Used     Comment: vapes  . Alcohol Use: 1.2 oz/week    2 Glasses of wine per week    Family History  Problem Relation Age of Onset  . Arthritis Mother   . Osteopenia Mother   . Heart disease Father     s/p 4V CABG  . Heart disease Maternal Grandmother     Medication list has been reviewed and updated.  Physical Examination:  Physical Exam  Constitutional: She is oriented to person, place, and time. She  appears well-developed and well-nourished. No distress.  HENT:  Head: Normocephalic and atraumatic.  Right Ear: Hearing normal.  Left Ear: Hearing normal.  Nose: Nose normal.  Eyes: Conjunctivae and lids are normal. Right eye exhibits no discharge. Left eye exhibits no discharge. No scleral icterus.  Pulmonary/Chest: Effort normal. No respiratory distress.  Musculoskeletal: Normal range of motion.  Neurological: She is alert and oriented to person, place, and time.  Skin: Skin is warm, dry and intact.  #5 sutures removed from upper mid back. Incision without evidence of infection, healing well, skin intact.  Psychiatric: She has a normal mood and affect. Her speech is normal and behavior is normal. Thought content normal.   BP 122/78 mmHg  Pulse 79  Temp(Src) 98.2 F (36.8 C) (Oral)  Resp 17  Ht 5' 8.5" (1.74 m)  Wt 188 lb (85.276 kg)  BMI 28.17 kg/m2  SpO2 99%  Assessment and Plan:  1. Infected sebaceous cyst #5 sutures removed. Incision has healed well. Return with further problems.   Benjaman Pott Drenda Freeze, MHS [facility]  09/29/2014

## 2014-09-30 NOTE — Telephone Encounter (Signed)
Referrals we still may need a referral on the website.

## 2014-09-30 NOTE — Telephone Encounter (Signed)
Done

## 2014-11-23 ENCOUNTER — Encounter: Payer: 59 | Admitting: Physician Assistant

## 2014-11-29 ENCOUNTER — Encounter: Payer: Self-pay | Admitting: Physician Assistant

## 2014-11-29 ENCOUNTER — Ambulatory Visit (INDEPENDENT_AMBULATORY_CARE_PROVIDER_SITE_OTHER): Payer: 59 | Admitting: Physician Assistant

## 2014-11-29 VITALS — BP 120/76 | HR 69 | Temp 98.3°F | Resp 16 | Ht 68.0 in | Wt 187.2 lb

## 2014-11-29 DIAGNOSIS — Z1211 Encounter for screening for malignant neoplasm of colon: Secondary | ICD-10-CM | POA: Diagnosis not present

## 2014-11-29 DIAGNOSIS — Z0000 Encounter for general adult medical examination without abnormal findings: Secondary | ICD-10-CM | POA: Diagnosis not present

## 2014-11-29 DIAGNOSIS — G500 Trigeminal neuralgia: Secondary | ICD-10-CM | POA: Diagnosis not present

## 2014-11-29 DIAGNOSIS — Z13220 Encounter for screening for lipoid disorders: Secondary | ICD-10-CM | POA: Diagnosis not present

## 2014-11-29 DIAGNOSIS — Z114 Encounter for screening for human immunodeficiency virus [HIV]: Secondary | ICD-10-CM | POA: Diagnosis not present

## 2014-11-29 DIAGNOSIS — Z1159 Encounter for screening for other viral diseases: Secondary | ICD-10-CM | POA: Diagnosis not present

## 2014-11-29 DIAGNOSIS — Z23 Encounter for immunization: Secondary | ICD-10-CM

## 2014-11-29 DIAGNOSIS — Z13228 Encounter for screening for other metabolic disorders: Secondary | ICD-10-CM

## 2014-11-29 DIAGNOSIS — Z1329 Encounter for screening for other suspected endocrine disorder: Secondary | ICD-10-CM | POA: Diagnosis not present

## 2014-11-29 DIAGNOSIS — Z130 Encounter for screening for diseases of the blood and blood-forming organs and certain disorders involving the immune mechanism: Secondary | ICD-10-CM

## 2014-11-29 DIAGNOSIS — N951 Menopausal and female climacteric states: Secondary | ICD-10-CM

## 2014-11-29 DIAGNOSIS — Z124 Encounter for screening for malignant neoplasm of cervix: Secondary | ICD-10-CM | POA: Diagnosis not present

## 2014-11-29 DIAGNOSIS — Z1231 Encounter for screening mammogram for malignant neoplasm of breast: Secondary | ICD-10-CM | POA: Diagnosis not present

## 2014-11-29 DIAGNOSIS — B009 Herpesviral infection, unspecified: Secondary | ICD-10-CM

## 2014-11-29 DIAGNOSIS — N959 Unspecified menopausal and perimenopausal disorder: Secondary | ICD-10-CM

## 2014-11-29 MED ORDER — ESTRADIOL-NORETHINDRONE ACET 0.5-0.1 MG PO TABS: 1.0000 | ORAL_TABLET | Freq: Every day | ORAL | Status: AC

## 2014-11-29 MED ORDER — ESTRADIOL-NORETHINDRONE ACET 0.05-0.25 MG/DAY TD PTTW: 1.0000 | MEDICATED_PATCH | TRANSDERMAL | Status: DC

## 2014-11-29 NOTE — Progress Notes (Signed)
Patient ID: Lindsay Barnes, female    DOB: [birth year], 59 y.o.   MRN: [id number]  PCP: No PCP Per Patient  Chief Complaint  Patient presents with  . Annual Exam    w/pap    Subjective:   HPI: Presents for annual exam.  Wants to have additional thyroid screening, beyond TSH, performed.  Is interested in testing for estrogen, testosterone and progestin levels. Had contacted a "[facility]" in [place], but they never called her back. Since then, she has quit her job, so cannot afford to go there anyway.  Hot flashes have improved with reducing wine consumption. Fatigue. Always some mild vaginal itching. Recent antibiotic for MRSA infection. Did not take Diflucan, but was prescribed it. Vaginal dryness. Some irritation with bathing. Occasional burning sensation with wiping. "It just feels sensitive." No significant discharge.  Does a lot of reading about health conditions and holistic approaches to address them.  "I still get fever blisters down there, you know." For the past few months with increased stress and a viral URI illness she's had more outbreaks both on the mouth and "on my vag."  Patient Active Problem List   Diagnosis Date Noted  . Trigeminal neuralgia of left side of face 11/29/2014  . Plantar fasciitis, bilateral     Past Medical History  Diagnosis Date  . Plantar fasciitis, bilateral      Prior to Admission medications   Medication Sig Start Date End Date Taking? Authorizing Provider  cholecalciferol (VITAMIN D) 1000 UNITS tablet Take 1,000 Units by mouth daily.   Yes Historical Provider, MD  fish oil-omega-3 fatty acids 1000 MG capsule Take 2 g by mouth daily.   Yes Historical Provider, MD  Specialty Vitamins Products (MAGNESIUM, AMINO ACID CHELATE,) 133 MG tablet Take 1 tablet by mouth 2 (two) times daily.   Yes Historical Provider, MD    No Known Allergies  Past Surgical History  Procedure Laterality Date  . Cesarean section       Family History  Problem Relation Age of Onset  . Arthritis Mother   . Osteopenia Mother   . Heart disease Father     s/p 4V CABG  . Heart disease Maternal Grandmother     Social History   Social History  . Marital Status: Single    Spouse Name: n/a  . Number of Children: N/A  . Years of Education: 16   Occupational History  . retail    Social History Main Topics  . Smoking status: Former Smoker -- 1.00 packs/day for 30 years    Quit date: 04/16/2012  . Smokeless tobacco: Never Used     Comment: vapes  . Alcohol Use: 1.2 oz/week    2 Glasses of wine per week  . Drug Use: No  . Sexual Activity: Not Asked   Other Topics Concern  . None   Social History Narrative   Lives with daughter and daughter's boyfriend and their baby       Review of Systems  Constitutional: Positive for fatigue. Negative for fever, chills, diaphoresis, activity change, appetite change and unexpected weight change.  HENT: Negative.   Eyes: Negative for visual disturbance.  Respiratory: Negative.   Cardiovascular: Negative.   Gastrointestinal: Negative.   Endocrine: Positive for heat intolerance. Negative for cold intolerance, polydipsia, polyphagia and polyuria.  Genitourinary: Positive for genital sores and vaginal pain ("sensitivity"). Negative for dysuria, urgency, frequency, hematuria, flank pain, decreased urine volume, vaginal bleeding, vaginal discharge, enuresis, difficulty urinating, menstrual problem, pelvic pain  and dyspareunia.  Allergic/Immunologic: Negative.   Neurological: Negative for dizziness, weakness, light-headedness and headaches.       Intermittent Trigeminal Neuralgia pain, LEFT face  Hematological: Negative for adenopathy. Does not bruise/bleed easily.  Psychiatric/Behavioral: Negative.         Objective:  Physical Exam  Constitutional: She is oriented to person, place, and time. Vital signs are normal. She appears well-developed and well-nourished. She is  active and cooperative. No distress.  BP 120/76 mmHg  Pulse 69  Temp(Src) 98.3 F (36.8 C) (Oral)  Resp 16  Ht 5\' 8"  (1.727 m)  Wt 187 lb 3.2 oz (84.913 kg)  BMI 28.47 kg/m2  SpO2 97%   HENT:  Head: Normocephalic and atraumatic.  Right Ear: Hearing, tympanic membrane, external ear and ear canal normal. No foreign bodies.  Left Ear: Hearing, tympanic membrane, external ear and ear canal normal. No foreign bodies.  Nose: Nose normal.  Mouth/Throat: Uvula is midline, oropharynx is clear and moist and mucous membranes are normal. No oral lesions. Normal dentition. No dental abscesses or uvula swelling. No oropharyngeal exudate.  Eyes: Conjunctivae, EOM and lids are normal. Pupils are equal, round, and reactive to light. Right eye exhibits no discharge. Left eye exhibits no discharge. No scleral icterus.  Fundoscopic exam:      The right eye shows no arteriolar narrowing, no AV nicking, no exudate, no hemorrhage and no papilledema.       The left eye shows no arteriolar narrowing, no AV nicking, no exudate, no hemorrhage and no papilledema.  Neck: Trachea normal, normal range of motion and full passive range of motion without pain. Neck supple. No spinous process tenderness and no muscular tenderness present. No thyroid mass and no thyromegaly present.  Cardiovascular: Normal rate, regular rhythm, normal heart sounds, intact distal pulses and normal pulses.   Pulmonary/Chest: Effort normal and breath sounds normal. She exhibits no tenderness and no retraction. Right breast exhibits no inverted nipple, no mass, no nipple discharge, no skin change and no tenderness. Left breast exhibits no inverted nipple, no mass, no nipple discharge, no skin change and no tenderness. Breasts are symmetrical.  Abdominal: Soft. Normal appearance and bowel sounds are normal. She exhibits no distension and no mass. There is no hepatosplenomegaly. There is no tenderness. There is no rigidity, no rebound, no guarding,  no CVA tenderness, no tenderness at McBurney's point and negative Murphy's sign. No hernia. Hernia confirmed negative in the right inguinal area and confirmed negative in the left inguinal area.  Genitourinary: Rectum normal, vagina normal and uterus normal. Rectal exam shows no external hemorrhoid and no fissure. No breast swelling, tenderness, discharge or bleeding. Pelvic exam was performed with patient supine. No labial fusion. There is no rash, tenderness, lesion or injury on the right labia. There is no rash, tenderness, lesion or injury on the left labia. Cervix exhibits no motion tenderness, no discharge and no friability. Right adnexum displays no mass, no tenderness and no fullness. Left adnexum displays no mass, no tenderness and no fullness. No erythema, tenderness or bleeding in the vagina. No foreign body around the vagina. No signs of injury around the vagina. No vaginal discharge found.  Musculoskeletal: She exhibits no edema or tenderness.       Cervical back: Normal.       Thoracic back: Normal.       Lumbar back: Normal.  Lymphadenopathy:       Head (right side): No tonsillar, no preauricular, no posterior auricular and no occipital  adenopathy present.       Head (left side): No tonsillar, no preauricular, no posterior auricular and no occipital adenopathy present.    She has no cervical adenopathy.    She has no axillary adenopathy.       Right: No inguinal and no supraclavicular adenopathy present.       Left: No inguinal and no supraclavicular adenopathy present.  Neurological: She is alert and oriented to person, place, and time. She has normal strength and normal reflexes. No cranial nerve deficit. She exhibits normal muscle tone. Coordination and gait normal.  Skin: Skin is warm, dry and intact. No rash noted. She is not diaphoretic. No cyanosis or erythema. Nails show no clubbing.  Psychiatric: She has a normal mood and affect. Her speech is normal and behavior is normal.  Judgment and thought content normal.           Assessment & Plan:  1. Annual physical exam Age appropriate anticipatory guidance provided.  2. Need for influenza vaccination - Flu Vaccine QUAD 36+ mos IM  3. Need for Tdap vaccination - Tdap vaccine greater than or equal to 7yo IM  4. Screening for HIV (human immunodeficiency virus) - HIV antibody  5. Need for hepatitis C screening test - Hepatitis C antibody  6. Screening for colon cancer - Ambulatory referral to Gastroenterology  7. Encounter for screening mammogram for breast cancer - MM Digital Screening; Future  8. Screening for deficiency anemia - CBC with Differential/Platelet  9. Screening for metabolic disorder - Comprehensive metabolic panel  10. Screening for hyperlipidemia - Lipid panel  11. Screening for thyroid disorder - TSH - T3, Free - T4, Free  12. Screening for cervical cancer If both cytology and HPV are negative, repeat in 5 years. - Pap IG and HPV (high risk) DNA detection  13. Trigeminal neuralgia of left side of face Stable. Doesn't desire treatment at this time.  14. Postmenopausal disorder Trial of HRT. Consider the addition of low-dose testosterone depending on response to treatment. - Estradiol-Norethindrone Acet 0.5-0.1 MG per tablet; Take 1 tablet by mouth daily.  Dispense: 90 tablet; Refill: 3   Fara Chute, PA-C Physician Assistant-Certified [facility]

## 2014-11-29 NOTE — Patient Instructions (Signed)
I will contact you with your lab results as soon as they are available.   If you have not heard from me in 2 weeks, please contact me.  The fastest way to get your results is to register for My Chart (see the instructions on the last page of this printout).  Keeping You Healthy  Get These Tests  Blood Pressure- Have your blood pressure checked by your healthcare provider at least once a year.  Normal blood pressure is 120/80.  Weight- Have your body mass index (BMI) calculated to screen for obesity.  BMI is a measure of body fat based on height and weight.  You can calculate your own BMI at www.nhlbisupport.com/bmi/  Cholesterol- Have your cholesterol checked every year.  Diabetes- Have your blood sugar checked every year if you have high blood pressure, high cholesterol, a family history of diabetes or if you are overweight.  Pap Test - Have a pap test every 1 to 5 years if you have been sexually active.  If you are older than 65 and recent pap tests have been normal you may not need additional pap tests.  In addition, if you have had a hysterectomy  for benign disease additional pap tests are not necessary.  Mammogram-Yearly mammograms are essential for early detection of breast cancer  Screening for Colon Cancer- Colonoscopy starting at age 50. Screening may begin sooner depending on your family history and other health conditions.  Follow up colonoscopy as directed by your Gastroenterologist.  Screening for Osteoporosis- Screening begins at age 65 with bone density scanning, sooner if you are at higher risk for developing Osteoporosis.  Get these medicines  Calcium with Vitamin D- Your body requires 1200-1500 mg of Calcium a day and 800-1000 IU of Vitamin D a day.  You can only absorb 500 mg of Calcium at a time therefore Calcium must be taken in 2 or 3 separate doses throughout the day.  Hormones- Hormone therapy has been associated with increased risk for certain cancers and heart  disease.  Talk to your healthcare provider about if you need relief from menopausal symptoms.  Aspirin- Ask your healthcare provider about taking Aspirin to prevent Heart Disease and Stroke.  Get these Immuniztions  Flu shot- Every fall  Pneumonia shot- Once after the age of 65; if you are younger ask your healthcare provider if you need a pneumonia shot.  Tetanus- Every ten years.  Zostavax- Once after the age of 60 to prevent shingles.  Take these steps  Don't smoke- Your healthcare provider can help you quit. For tips on how to quit, ask your healthcare provider or go to www.smokefree.gov or call 1-800 QUIT-NOW.  Be physically active- Exercise 5 days a week for a minimum of 30 minutes.  If you are not already physically active, start slow and gradually work up to 30 minutes of moderate physical activity.  Try walking, dancing, bike riding, swimming, etc.  Eat a healthy diet- Eat a variety of healthy foods such as fruits, vegetables, whole grains, low fat milk, low fat cheeses, yogurt, lean meats, chicken, fish, eggs, dried beans, tofu, etc.  For more information go to www.thenutritionsource.org  Dental visit- Brush and floss teeth twice daily; visit your dentist twice a year.  Eye exam- Visit your Optometrist or Ophthalmologist yearly.  Drink alcohol in moderation- Limit alcohol intake to one drink or less a day.  Never drink and drive.  Depression- Your emotional health is as important as your physical health.  If you're   feeling down or losing interest in things you normally enjoy, please talk to your healthcare provider.  Seat Belts- can save your life; always wear one  Smoke/Carbon Monoxide detectors- These detectors need to be installed on the appropriate level of your home.  Replace batteries at least once a year.  Violence- If anyone is threatening or hurting you, please tell your healthcare provider.  Living Will/ Health care power of attorney- Discuss with your  healthcare provider and family. 

## 2014-11-30 ENCOUNTER — Ambulatory Visit (AMBULATORY_SURGERY_CENTER): Payer: Self-pay

## 2014-11-30 VITALS — Ht 67.5 in | Wt 186.0 lb

## 2014-11-30 DIAGNOSIS — Z1211 Encounter for screening for malignant neoplasm of colon: Secondary | ICD-10-CM

## 2014-11-30 LAB — COMPREHENSIVE METABOLIC PANEL
A/G RATIO: 1.9 (ref 1.1–2.5)
ALT: 27 IU/L (ref 0–32)
AST: 22 IU/L (ref 0–40)
Albumin: 4.5 g/dL (ref 3.5–5.5)
Alkaline Phosphatase: 85 IU/L (ref 39–117)
BILIRUBIN TOTAL: 0.3 mg/dL (ref 0.0–1.2)
BUN/Creatinine Ratio: 10 (ref 9–23)
BUN: 9 mg/dL (ref 6–24)
CALCIUM: 10 mg/dL (ref 8.7–10.2)
CHLORIDE: 100 mmol/L (ref 97–108)
CO2: 24 mmol/L (ref 18–29)
Creatinine, Ser: 0.91 mg/dL (ref 0.57–1.00)
GFR calc Af Amer: 80 mL/min/{1.73_m2} (ref >59)
GFR calc non Af Amer: 69 mL/min/{1.73_m2} (ref >59)
Globulin, Total: 2.4 g/dL (ref 1.5–4.5)
Glucose: 102 mg/dL — ABNORMAL HIGH (ref 65–99)
POTASSIUM: 4.8 mmol/L (ref 3.5–5.2)
Sodium: 140 mmol/L (ref 134–144)
Total Protein: 6.9 g/dL (ref 6.0–8.5)

## 2014-11-30 LAB — T4, FREE: FREE T4: 1.07 ng/dL (ref 0.82–1.77)

## 2014-11-30 LAB — HIV ANTIBODY (ROUTINE TESTING W REFLEX): HIV Screen 4th Generation wRfx: NONREACTIVE

## 2014-11-30 LAB — LIPID PANEL
Chol/HDL Ratio: 4.1 ratio units (ref 0.0–4.4)
Cholesterol, Total: 222 mg/dL — ABNORMAL HIGH (ref 100–199)
HDL: 54 mg/dL (ref >39)
LDL Calculated: 127 mg/dL — ABNORMAL HIGH (ref 0–99)
TRIGLYCERIDES: 203 mg/dL — AB (ref 0–149)
VLDL Cholesterol Cal: 41 mg/dL — ABNORMAL HIGH (ref 5–40)

## 2014-11-30 LAB — CBC WITH DIFFERENTIAL/PLATELET
BASOS ABS: 0 10*3/uL (ref 0.0–0.2)
Basos: 1 %
EOS (ABSOLUTE): 0.1 10*3/uL (ref 0.0–0.4)
EOS: 1 %
Hematocrit: 46.8 % — ABNORMAL HIGH (ref 34.0–46.6)
Hemoglobin: 15.9 g/dL (ref 11.1–15.9)
IMMATURE GRANULOCYTES: 0 %
Immature Grans (Abs): 0 10*3/uL (ref 0.0–0.1)
LYMPHS ABS: 2.9 10*3/uL (ref 0.7–3.1)
LYMPHS: 37 %
MCH: 29.4 pg (ref 26.6–33.0)
MCHC: 34 g/dL (ref 31.5–35.7)
MCV: 87 fL (ref 79–97)
MONOCYTES: 8 %
Monocytes Absolute: 0.6 10*3/uL (ref 0.1–0.9)
NEUTROS ABS: 4.1 10*3/uL (ref 1.4–7.0)
Neutrophils: 53 %
PLATELETS: 332 10*3/uL (ref 150–379)
RBC: 5.4 x10E6/uL — AB (ref 3.77–5.28)
RDW: 13.3 % (ref 12.3–15.4)
WBC: 7.8 10*3/uL (ref 3.4–10.8)

## 2014-11-30 LAB — T3, FREE: T3 FREE: 3.3 pg/mL (ref 2.0–4.4)

## 2014-11-30 LAB — TSH: TSH: 2.66 u[IU]/mL (ref 0.450–4.500)

## 2014-11-30 LAB — HEPATITIS C ANTIBODY: Hep C Virus Ab: <0.1 s/co ratio (ref 0.0–0.9)

## 2014-11-30 NOTE — Progress Notes (Signed)
No egg or soy allergy.  No previous complications from anesthesia. No home O2. No diet meds. 

## 2014-12-01 DIAGNOSIS — B009 Herpesviral infection, unspecified: Secondary | ICD-10-CM | POA: Insufficient documentation

## 2014-12-02 LAB — PAP IG AND HPV HIGH-RISK
HPV, high-risk: NEGATIVE
PAP Smear Comment: 0

## 2014-12-05 ENCOUNTER — Encounter: Payer: Self-pay | Admitting: Physician Assistant

## 2014-12-07 ENCOUNTER — Telehealth: Payer: Self-pay | Admitting: Physician Assistant

## 2014-12-07 NOTE — Telephone Encounter (Signed)
Ms. Boye stopped by saying Chelle was supposed to send a referral for her to have a Screening Mammogram. She's not sure of the locations we use but would like a referral sent. Please call the pt. Pt ph# [phone] Thank you.

## 2014-12-08 ENCOUNTER — Telehealth: Payer: Self-pay | Admitting: Family Medicine

## 2014-12-08 DIAGNOSIS — Z1231 Encounter for screening mammogram for malignant neoplasm of breast: Secondary | ICD-10-CM

## 2014-12-08 NOTE — Telephone Encounter (Signed)
I ordered the screening mammogram on 11/29/2014. I typically select [facility], but she can have it anywhere she prefers.   [facility] - [phone]  *ask for the Radiology Department [facility] - [phone] or [phone]  [facility] - [phone] [place] [phone] [facility] - [phone]  *ask for the [facility] - [phone]  *ask for the Radiology Department [facility] - [phone]  *ask for the [place] - [phone]

## 2014-12-08 NOTE — Telephone Encounter (Signed)
Ms. Damewood stopped by saying Lindsay Barnes was supposed to send a referral for her to have a Screening Mammogram. She's not sure of the locations we use but would like a referral sent. Please call the pt. Pt ph# [phone] Thank you.

## 2014-12-09 NOTE — Telephone Encounter (Signed)
Chelle I spoke to you about this patient I believe and I do not see a referral for a mammogram. I don't understand why I don't see it. I re-ordered mammo.

## 2014-12-14 ENCOUNTER — Encounter: Payer: Self-pay | Admitting: Internal Medicine

## 2014-12-14 ENCOUNTER — Ambulatory Visit (AMBULATORY_SURGERY_CENTER): Payer: 59 | Admitting: Internal Medicine

## 2014-12-14 VITALS — BP 117/75 | HR 67 | Temp 97.4°F | Resp 16 | Ht 67.0 in

## 2014-12-14 DIAGNOSIS — Z1211 Encounter for screening for malignant neoplasm of colon: Secondary | ICD-10-CM

## 2014-12-14 DIAGNOSIS — K635 Polyp of colon: Secondary | ICD-10-CM

## 2014-12-14 DIAGNOSIS — K621 Rectal polyp: Secondary | ICD-10-CM | POA: Diagnosis not present

## 2014-12-14 DIAGNOSIS — D125 Benign neoplasm of sigmoid colon: Secondary | ICD-10-CM | POA: Diagnosis not present

## 2014-12-14 MED ORDER — SODIUM CHLORIDE 0.9 % IV SOLN: 500.0000 mL | INTRAVENOUS | Status: DC

## 2014-12-14 NOTE — Progress Notes (Signed)
Called to room to assist during endoscopic procedure.  Patient ID and intended procedure confirmed with present staff. Received instructions for my participation in the procedure from the performing physician.  

## 2014-12-14 NOTE — Op Note (Signed)
[facility]  [address] [place], [address]   COLONOSCOPY PROCEDURE REPORT  PATIENT: Lindsay Barnes, Lindsay Barnes  MR#: [id number] BIRTHDATE: 1955/05/18 , 70  yrs. old GENDER: female ENDOSCOPIST: Jerene Bears, MD REFERRED SV:[id number] Dellis Fil[place], [place] PROCEDURE DATE:  12/14/2014 PROCEDURE:   Colonoscopy, screening and Colonoscopy with snare polypectomy First Screening Colonoscopy - Avg.  risk and is 50 yrs.  old or older Yes.  Prior Negative Screening - Now for repeat screening. N/A  History of Adenoma - Now for follow-up colonoscopy & has been > or = to 3 yrs.  N/A  Polyps removed today? Yes ASA CLASS:   Class II INDICATIONS:Screening for colonic neoplasia and average risk, 1st colonoscopy. MEDICATIONS: Monitored anesthesia care and Propofol 230 mg IV  DESCRIPTION OF PROCEDURE:   After the risks benefits and alternatives of the procedure were thoroughly explained, informed consent was obtained.  The digital rectal exam revealed no rectal mass.   The LB [id number]  endoscope was introduced through the anus and advanced to the cecum, which was identified by both the appendix and ileocecal valve. No adverse events experienced. The quality of the prep was good.  (Suprep was used)  The instrument was then slowly withdrawn as the colon was fully examined. Estimated blood loss is zero unless otherwise noted in this procedure report.   COLON FINDINGS: Three sessile polyps ranging from 5 to 109mm in size were found in the rectosigmoid colon.  Polypectomies were performed with a cold snare.  The resection was complete, the polyp tissue was completely retrieved and sent to histology.   There was severe diverticulosis noted in the ascending colon, at the hepatic flexure, and in the left colon with associated muscular hypertrophy.  Retroflexed views revealed internal hemorrhoids. The time to cecum = 4.1 Withdrawal time = 10.1   The scope was withdrawn and the procedure  completed. COMPLICATIONS: There were no immediate complications.  ENDOSCOPIC IMPRESSION: 1.   Three sessile polyps ranging from 5 to 104mm in size were found in the rectosigmoid colon; polypectomies were performed with a cold snare 2.   There was severe diverticulosis noted in the ascending colon, at the hepatic flexure, and in the left colon  RECOMMENDATIONS: 1.  Await pathology results 2.  High fiber diet 3.  Timing of repeat colonoscopy will be determined by pathology findings. 4.  You will receive a letter within 1-2 weeks with the results of your biopsy as well as final recommendations.  Please call my office if you have not received a letter after 3 weeks.  eSigned:  Jerene Bears, MD 12/14/2014 11:14 AM  cc: Daphane Shepherd, PA and The Patient

## 2014-12-14 NOTE — Patient Instructions (Signed)
YOU HAD AN ENDOSCOPIC PROCEDURE TODAY AT THE [facility]:   Refer to the procedure report that was given to you for any specific questions about what was found during the examination.  If the procedure report does not answer your questions, please call your gastroenterologist to clarify.  If you requested that your care partner not be given the details of your procedure findings, then the procedure report has been included in a sealed envelope for you to review at your convenience later.  YOU SHOULD EXPECT: Some feelings of bloating in the abdomen. Passage of more gas than usual.  Walking can help get rid of the air that was put into your GI tract during the procedure and reduce the bloating. If you had a lower endoscopy (such as a colonoscopy or flexible sigmoidoscopy) you may notice spotting of blood in your stool or on the toilet paper. If you underwent a bowel prep for your procedure, you may not have a normal bowel movement for a few days.  Please Note:  You might notice some irritation and congestion in your nose or some drainage.  This is from the oxygen used during your procedure.  There is no need for concern and it should clear up in a day or so.  SYMPTOMS TO REPORT IMMEDIATELY:   Following lower endoscopy (colonoscopy or flexible sigmoidoscopy):  Excessive amounts of blood in the stool  Significant tenderness or worsening of abdominal pains  Swelling of the abdomen that is new, acute  Fever of 100F or higher   For urgent or emergent issues, a gastroenterologist can be reached at any hour by calling [phone].   DIET: Your first meal following the procedure should be a small meal and then it is ok to progress to your normal diet. Heavy or fried foods are harder to digest and may make you feel nauseous or bloated.  Likewise, meals heavy in dairy and vegetables can increase bloating.  Drink plenty of fluids but you should avoid alcoholic beverages for 24  hours.  ACTIVITY:  You should plan to take it easy for the rest of today and you should NOT DRIVE or use heavy machinery until tomorrow (because of the sedation medicines used during the test).    FOLLOW UP: Our staff will call the number listed on your records the next business day following your procedure to check on you and address any questions or concerns that you may have regarding the information given to you following your procedure. If we do not reach you, we will leave a message.  However, if you are feeling well and you are not experiencing any problems, there is no need to return our call.  We will assume that you have returned to your regular daily activities without incident.  If any biopsies were taken you will be contacted by phone or by letter within the next 1-3 weeks.  Please call us at [phone] if you have not heard about the biopsies in 3 weeks.    SIGNATURES/CONFIDENTIALITY: You and/or your care partner have signed paperwork which will be entered into your electronic medical record.  These signatures attest to the fact that that the information above on your After Visit Summary has been reviewed and is understood.  Full responsibility of the confidentiality of this discharge information lies with you and/or your care-partner. 

## 2014-12-14 NOTE — Progress Notes (Signed)
Report to PACU, RN, vss, BBS= Clear.  

## 2014-12-15 ENCOUNTER — Telehealth: Payer: Self-pay | Admitting: Emergency Medicine

## 2014-12-15 NOTE — Telephone Encounter (Signed)
  Follow up Call-  Call back number 12/14/2014  Post procedure Call Back phone  # [phone]  Permission to leave phone message Yes     Patient questions:  Do you have a fever, pain , or abdominal swelling? No. Pain Score  0 *  Have you tolerated food without any problems? Yes.    Have you been able to return to your normal activities? Yes.    Do you have any questions about your discharge instructions: Diet   No. Medications  No. Follow up visit  No.  Do you have questions or concerns about your Care? No.  Actions: * If pain score is 4 or above: No action needed, pain <4.

## 2014-12-20 ENCOUNTER — Encounter: Payer: Self-pay | Admitting: Internal Medicine

## 2015-01-12 ENCOUNTER — Ambulatory Visit
Admission: RE | Admit: 2015-01-12 | Discharge: 2015-01-12 | Disposition: A | Discharge status: Home / Self Care | Payer: 59 | Source: Ambulatory Visit | Attending: Physician Assistant | Admitting: Physician Assistant

## 2015-01-12 ENCOUNTER — Telehealth: Payer: Self-pay | Admitting: Physician Assistant

## 2015-01-12 DIAGNOSIS — Z1231 Encounter for screening mammogram for malignant neoplasm of breast: Secondary | ICD-10-CM

## 2015-01-12 NOTE — Telephone Encounter (Signed)
Light bleeding and cramping are common side effects of using hormones, even the topical products with low levels of absorption. I am not aware of bloating associated with this treatment.  If the symptoms are too bothersome, she can certainly discontinue it. She can also try breaking it in half, and see if she still gets benefit, but without the side effects.

## 2015-01-12 NOTE — Telephone Encounter (Signed)
Patient states that since beginning the Estradiol she is experiencing light bleeding, bloating, and cramping. She states that her hot flashes have gotten better. I also gave patient the number to set up communication via MyChart as she states that she prefers to communicate through e-mail. Please call patient at [phone]

## 2015-01-13 NOTE — Telephone Encounter (Signed)
Spoke with pt, advised message from Chelle. Pt understood. 

## 2015-01-24 ENCOUNTER — Encounter: Payer: 59 | Admitting: Physician Assistant

## 2015-01-24 VITALS — BP 114/78 | HR 74 | Temp 98.0°F | Resp 18 | Ht 68.0 in | Wt 187.0 lb

## 2015-01-24 DIAGNOSIS — R350 Frequency of micturition: Secondary | ICD-10-CM

## 2015-01-24 LAB — POCT URINALYSIS DIP (MANUAL ENTRY)
BILIRUBIN UA: NEGATIVE
Glucose, UA: NEGATIVE
NITRITE UA: NEGATIVE
PH UA: 6
Protein Ur, POC: NEGATIVE
Spec Grav, UA: 1.02
UROBILINOGEN UA: 0.2

## 2015-01-24 LAB — POC MICROSCOPIC URINALYSIS (UMFC)

## 2015-01-25 ENCOUNTER — Ambulatory Visit (INDEPENDENT_AMBULATORY_CARE_PROVIDER_SITE_OTHER): Payer: 59

## 2015-01-25 ENCOUNTER — Ambulatory Visit (INDEPENDENT_AMBULATORY_CARE_PROVIDER_SITE_OTHER): Payer: 59 | Admitting: Urgent Care

## 2015-01-25 VITALS — BP 111/75 | HR 68 | Temp 97.9°F | Resp 16 | Ht 67.5 in | Wt 188.0 lb

## 2015-01-25 DIAGNOSIS — M25571 Pain in right ankle and joints of right foot: Secondary | ICD-10-CM | POA: Diagnosis not present

## 2015-01-25 DIAGNOSIS — M25474 Effusion, right foot: Secondary | ICD-10-CM

## 2015-01-25 DIAGNOSIS — M25551 Pain in right hip: Secondary | ICD-10-CM | POA: Diagnosis not present

## 2015-01-25 DIAGNOSIS — M25471 Effusion, right ankle: Secondary | ICD-10-CM

## 2015-01-25 DIAGNOSIS — N3090 Cystitis, unspecified without hematuria: Secondary | ICD-10-CM

## 2015-01-25 DIAGNOSIS — M5489 Other dorsalgia: Secondary | ICD-10-CM

## 2015-01-25 MED ORDER — NITROFURANTOIN MONOHYD MACRO 100 MG PO CAPS: 100.0000 mg | ORAL_CAPSULE | Freq: Two times a day (BID) | ORAL | Status: DC

## 2015-01-25 MED ORDER — NAPROXEN SODIUM 550 MG PO TABS: 550.0000 mg | ORAL_TABLET | Freq: Two times a day (BID) | ORAL | Status: DC

## 2015-01-25 NOTE — Progress Notes (Signed)
This encounter was created in error - please disregard.

## 2015-01-25 NOTE — Progress Notes (Signed)
MRN: [id number] DOB: 14-Dec-1955  Subjective:   Lindsay Barnes is a 59 y.o. female presenting for chief complaint of Urinary Tract Infection and Ankle Pain  UTI - reports 3 week history of urinary frequency, urinary urgency, vaginal irritation. Has not tried any medications but has used cranberry supplements. Denies fever, dysuria, hematuria, cloudy urine, genital rashes, n/v, abdominal pain, flank pain. Has had a history of UTIs in the past, these symptoms are similar but lesser in degree. Last one ~6 years ago.  Ankle - reports 2 week history of right ankle swelling up to lower calf. Has difficulty bearing weight, walks with a limp as well. Of note, she has spent a lot of time on her feet lately. Has used Alleve occasionally with some relief. ROS as above, denies trauma or injury, erythema, numbness or tingling, bony deformity, ecchymosis. Denies personal or family history of gout. Has a history of hip pain that she thinks is related. Would like referral to her chiropractor for her hip which she thinks may help her back. She has previously seen a chiropractor for her hip with good results. Her insurance won't pay for the chiropractor without a referral.  Denies any other aggravating or relieving factors, no other questions or concerns.  Lindsay Barnes has a current medication Barnes which includes the following prescription(s): cholecalciferol, fish oil-omega-3 fatty acids, magnesium (amino acid chelate), vitamin c, and estradiol-norethindrone acet. Also has No Known Allergies.  Lindsay Barnes  has a past medical history of Plantar fasciitis, bilateral. Also  has past surgical history that includes Cesarean section and Cervical cone biopsy.  Objective:   Vitals: BP 111/75 mmHg  Pulse 68  Temp(Src) 97.9 F (36.6 C) (Oral)  Resp 16  Ht 5' 7.5" (1.715 m)  Wt 188 lb (85.276 kg)  BMI 28.99 kg/m2  Physical Exam  Constitutional: She is oriented to person, place, and time. She appears well-developed and  well-nourished.  Cardiovascular: Normal rate, regular rhythm and intact distal pulses.  Exam reveals no gallop and no friction rub.   No murmur heard. Pulmonary/Chest: No respiratory distress. She has no wheezes. She has no rales.  Abdominal: Soft. Bowel sounds are normal. She exhibits no distension and no mass. There is no tenderness.  Musculoskeletal:       Right hip: She exhibits tenderness (posterior iliac border). She exhibits normal range of motion, normal strength, no bony tenderness, no swelling, no crepitus, no deformity and no laceration.       Right ankle: She exhibits swelling (laterally). She exhibits normal range of motion, no ecchymosis, no deformity, no laceration and normal pulse. Tenderness. Lateral malleolus and posterior TFL tenderness found. No medial malleolus, no AITFL, no CF ligament, no head of 5th metatarsal and no proximal fibula tenderness found. Achilles tendon exhibits no pain and no defect.  Neurological: She is alert and oriented to person, place, and time.  Skin: Skin is warm and dry. No rash noted. No erythema. No pallor.   [facility] reading (PRIMARY) by  Dr. Lorelei Pont and PA-Lindsay Barnes. Right ankle - normal.  Results for orders placed or performed in visit on 01/24/15 (from the past 24 hour(s))  POCT urinalysis dipstick     Status: Abnormal   Collection Time: 01/24/15  2:38 PM  Result Value Ref Range   Color, UA yellow yellow   Clarity, UA clear clear   Glucose, UA negative negative   Bilirubin, UA negative negative   Ketones, POC UA trace (5) (A) negative   Spec Grav, UA 1.020  Blood, UA trace-intact (A) negative   pH, UA 6.0    Protein Ur, POC negative negative   Urobilinogen, UA 0.2    Nitrite, UA Negative Negative   Leukocytes, UA Trace (A) Negative  POCT Microscopic Urinalysis ([facility])     Status: Abnormal   Collection Time: 01/24/15  2:38 PM  Result Value Ref Range   WBC,UR,HPF,POC Moderate (A) None WBC/hpf   RBC,UR,HPF,POC Few (A) None RBC/hpf    Bacteria Few (A) None   Mucus Present (A) Absent   Epithelial Cells, UR Per Microscopy Few (A) None cells/hpf   Assessment and Plan :   1. Cystitis - Patient refused urine culture. Prefers to rtc if no improvement with Macrobid.  2. Pain and swelling of ankle, right - Patient refused blood work, discussed differential and patient agreed to conservative management including RICE method. Refer to ortho in 2 weeks no improvement.   3. Hip pain, right 4. Midline back pain, unspecified location - Referral to chiropractor, patient agreed to Anaprox for pain.  Jaynee Eagles, PA-C [facility] [phone] 01/25/2015 10:42 AM

## 2015-01-25 NOTE — Patient Instructions (Addendum)
Urinary Tract Infection Urinary tract infections (UTIs) can develop anywhere along your urinary tract. Your urinary tract is your body's drainage system for removing wastes and extra water. Your urinary tract includes two kidneys, two ureters, a bladder, and a urethra. Your kidneys are a pair of bean-shaped organs. Each kidney is about the size of your fist. They are located below your ribs, one on each side of your spine. CAUSES Infections are caused by microbes, which are microscopic organisms, including fungi, viruses, and bacteria. These organisms are so small that they can only be seen through a microscope. Bacteria are the microbes that most commonly cause UTIs. SYMPTOMS  Symptoms of UTIs may vary by age and gender of the patient and by the location of the infection. Symptoms in young women typically include a frequent and intense urge to urinate and a painful, burning feeling in the bladder or urethra during urination. Older women and men are more likely to be tired, shaky, and weak and have muscle aches and abdominal pain. A fever may mean the infection is in your kidneys. Other symptoms of a kidney infection include pain in your back or sides below the ribs, nausea, and vomiting. DIAGNOSIS To diagnose a UTI, your caregiver will ask you about your symptoms. Your caregiver will also ask you to provide a urine sample. The urine sample will be tested for bacteria and white blood cells. White blood cells are made by your body to help fight infection. TREATMENT  Typically, UTIs can be treated with medication. Because most UTIs are caused by a bacterial infection, they usually can be treated with the use of antibiotics. The choice of antibiotic and length of treatment depend on your symptoms and the type of bacteria causing your infection. HOME CARE INSTRUCTIONS  If you were prescribed antibiotics, take them exactly as your caregiver instructs you. Finish the medication even if you feel better after  you have only taken some of the medication.  Drink enough water and fluids to keep your urine clear or pale yellow.  Avoid caffeine, tea, and carbonated beverages. They tend to irritate your bladder.  Empty your bladder often. Avoid holding urine for long periods of time.  Empty your bladder before and after sexual intercourse.  After a bowel movement, women should cleanse from front to back. Use each tissue only once. SEEK MEDICAL CARE IF:   You have back pain.  You develop a fever.  Your symptoms do not begin to resolve within 3 days. SEEK IMMEDIATE MEDICAL CARE IF:   You have severe back pain or lower abdominal pain.  You develop chills.  You have nausea or vomiting.  You have continued burning or discomfort with urination. MAKE SURE YOU:   Understand these instructions.  Will watch your condition.  Will get help right away if you are not doing well or get worse.   This information is not intended to replace advice given to you by your health care provider. Make sure you discuss any questions you have with your health care provider.   Document Released: 01/02/2005 Document Revised: 12/14/2014 Document Reviewed: 05/03/2011 Elsevier Interactive Patient Education 2016 Elsevier Inc.    Ankle Pain Ankle pain is a common symptom. The bones, cartilage, tendons, and muscles of the ankle joint perform a lot of work each day. The ankle joint holds your body weight and allows you to move around. Ankle pain can occur on either side or back of 1 or both ankles. Ankle pain may be sharp and  burning or dull and aching. There may be tenderness, stiffness, redness, or warmth around the ankle. The pain occurs more often when a person walks or puts pressure on the ankle. CAUSES  There are many reasons ankle pain can develop. It is important to work with your caregiver to identify the cause since many conditions can impact the bones, cartilage, muscles, and tendons. Causes for ankle pain  include:  Injury, including a break (fracture), sprain, or strain often due to a fall, sports, or a high-impact activity.  Swelling (inflammation) of a tendon (tendonitis).  Achilles tendon rupture.  Ankle instability after repeated sprains and strains.  Poor foot alignment.  Pressure on a nerve (tarsal tunnel syndrome).  Arthritis in the ankle or the lining of the ankle.  Crystal formation in the ankle (gout or pseudogout). DIAGNOSIS  A diagnosis is based on your medical history, your symptoms, results of your physical exam, and results of diagnostic tests. Diagnostic tests may include X-ray exams or a computerized magnetic scan (magnetic resonance imaging, MRI). TREATMENT  Treatment will depend on the cause of your ankle pain and may include:  Keeping pressure off the ankle and limiting activities.  Using crutches or other walking support (a cane or brace).  Using rest, ice, compression, and elevation.  Participating in physical therapy or home exercises.  Wearing shoe inserts or special shoes.  Losing weight.  Taking medications to reduce pain or swelling or receiving an injection.  Undergoing surgery. HOME CARE INSTRUCTIONS   Only take over-the-counter or prescription medicines for pain, discomfort, or fever as directed by your caregiver.  Put ice on the injured area.  Put ice in a plastic bag.  Place a towel between your skin and the bag.  Leave the ice on for 15-20 minutes at a time, 03-04 times a day.  Keep your leg raised (elevated) when possible to lessen swelling.  Avoid activities that cause ankle pain.  Follow specific exercises as directed by your caregiver.  Record how often you have ankle pain, the location of the pain, and what it feels like. This information may be helpful to you and your caregiver.  Ask your caregiver about returning to work or sports and whether you should drive.  Follow up with your caregiver for further examination,  therapy, or testing as directed. SEEK MEDICAL CARE IF:   Pain or swelling continues or worsens beyond 1 week.  You have an oral temperature above 102 F (38.9 C).  You are feeling unwell or have chills.  You are having an increasingly difficult time with walking.  You have loss of sensation or other new symptoms.  You have questions or concerns. MAKE SURE YOU:   Understand these instructions.  Will watch your condition.  Will get help right away if you are not doing well or get worse.   This information is not intended to replace advice given to you by your health care provider. Make sure you discuss any questions you have with your health care provider.   Document Released: 09/12/2009 Document Revised: 06/17/2011 Document Reviewed: 10/25/2014 Elsevier Interactive Patient Education 2016 [place] for Routine Care of Injuries Theroutine careofmanyinjuriesincludes rest, ice, compression, and elevation (RICE therapy). RICE therapy is often recommended for injuries to soft tissues, such as a muscle strain, ligament injuries, bruises, and overuse injuries. It can also be used for some bony injuries. Using RICE therapy can help to relieve pain, lessen swelling, and enable your body to heal. Rest Rest  is required to allow your body to heal. This usually involves reducing your normal activities and avoiding use of the injured part of your body. Generally, you can return to your normal activities when you are comfortable and have been given permission by your health care provider. Ice Icing your injury helps to keep the swelling down, and it lessens pain. Do not apply ice directly to your skin.  Put ice in a plastic bag.  Place a towel between your skin and the bag.  Leave the ice on for 20 minutes, 2-3 times a day. Do this for as long as you are directed by your health care provider. Compression Compression means putting pressure on the injured area. Compression  helps to keep swelling down, gives support, and helps with discomfort. Compression may be done with an elastic bandage. If an elastic bandage has been applied, follow these general tips:  Remove and reapply the bandage every 3-4 hours or as directed by your health care provider.  Make sure the bandage is not wrapped too tightly, because this can cut off circulation. If part of your body beyond the bandage becomes blue, numb, cold, swollen, or more painful, your bandage is most likely too tight. If this occurs, remove your bandage and reapply it more loosely.  See your health care provider if the bandage seems to be making your problems worse rather than better. Elevation Elevation means keeping the injured area raised. This helps to lessen swelling and decrease pain. If possible, your injured area should be elevated at or above the level of your heart or the center of your chest. [place]? You should seek medical care if:  Your pain and swelling continue.  Your symptoms are getting worse rather than improving. These symptoms may indicate that further evaluation or further X-rays are needed. Sometimes, X-rays may not show a small broken bone (fracture) until a number of days later. Make a follow-up appointment with your health care provider. WHEN SHOULD I SEEK IMMEDIATE MEDICAL CARE? You should seek immediate medical care if:  You have sudden severe pain at or below the area of your injury.  You have redness or increased swelling around your injury.  You have tingling or numbness at or below the area of your injury that does not improve after you remove the elastic bandage.   This information is not intended to replace advice given to you by your health care provider. Make sure you discuss any questions you have with your health care provider.   Document Released: 07/07/2000 Document Revised: 12/14/2014 Document Reviewed: 03/02/2014 Elsevier Interactive Patient  Education [employer].

## 2015-02-01 NOTE — Progress Notes (Addendum)
Called to check on her- UTI is better She does not have any swelling in the calf- just minimal swelling over the bilateral ankle bones.  No concern for DVT.  She may need a new referral as her old chiropractor has left the office.  She will let us know if she needs this

## 2015-02-08 ENCOUNTER — Encounter: Payer: Self-pay | Admitting: Urgent Care

## 2015-02-08 ENCOUNTER — Ambulatory Visit (INDEPENDENT_AMBULATORY_CARE_PROVIDER_SITE_OTHER): Payer: 59 | Admitting: Urgent Care

## 2015-02-08 VITALS — BP 110/71 | HR 71 | Temp 98.1°F | Resp 16 | Ht 67.5 in | Wt 185.6 lb

## 2015-02-08 DIAGNOSIS — M25579 Pain in unspecified ankle and joints of unspecified foot: Secondary | ICD-10-CM

## 2015-02-08 DIAGNOSIS — M25473 Effusion, unspecified ankle: Secondary | ICD-10-CM | POA: Diagnosis not present

## 2015-02-08 DIAGNOSIS — M722 Plantar fascial fibromatosis: Secondary | ICD-10-CM

## 2015-02-08 DIAGNOSIS — M25551 Pain in right hip: Secondary | ICD-10-CM | POA: Diagnosis not present

## 2015-02-08 DIAGNOSIS — M79673 Pain in unspecified foot: Secondary | ICD-10-CM

## 2015-02-08 MED ORDER — PREDNISONE 20 MG PO TABS: 20.0000 mg | ORAL_TABLET | Freq: Every day | ORAL | Status: DC

## 2015-02-08 NOTE — Progress Notes (Signed)
    MRN: [id number] DOB: [birth year]  Subjective:   Lindsay Barnes is a 59 y.o. female presenting for follow up on UTI and right ankle pain.   UTI - completed course of Macrobid. States that her UTI symptoms are completely resolved. Denies fever, dysuria, hematuria, urinary frequency, vaginal irritation, genital rashes, abdominal pain, flank pain.  Right ankle - has been taken Anaprox as needed, resting off of her feet, taking it easy, wearing an ankle brace. She denies trauma, erythema, bony deformity. Patient is very anxious about not having this issue resolved prior to her insurance policy ending. She is now having bilateral heel pain. Has a history of plantar fasciitis which took several months to resolve. Patient also has ongoing right hip pain. Her visit with a chiropractor had been scheduled as requested, however patient canceled because she was not to be able to see the chiropractor she wanted. She is requesting another referral to a different practice within her insurance policy, she is providing the information to me today.  Denies any other aggravating or relieving factors, no other questions or concerns.  Lindsay Barnes has a current medication list which includes the following prescription(s): cholecalciferol, estradiol-norethindrone acet, fish oil-omega-3 fatty acids, multiple vitamin, magnesium (amino acid chelate), vitamin c, naproxen sodium, and nitrofurantoin (macrocrystal-monohydrate). Also has No Known Allergies.  Lindsay Barnes  has a past medical history of Plantar fasciitis, bilateral. Also  has past surgical history that includes Cesarean section and Cervical cone biopsy.  Objective:   Vitals: BP 110/71 mmHg  Pulse 71  Temp(Src) 98.1 F (36.7 C) (Oral)  Resp 16  Ht 5' 7.5" (1.715 m)  Wt 185 lb 9.6 oz (84.188 kg)  BMI 28.62 kg/m2  Physical Exam  Constitutional: She is oriented to person, place, and time. She appears well-developed and well-nourished.  Cardiovascular: Normal rate.     Pulmonary/Chest: Effort normal.  Musculoskeletal:       Right ankle: She exhibits swelling (trace over lateral malleolus). She exhibits normal range of motion, no ecchymosis, no deformity and no laceration. Tenderness (over heels bilaterally). Lateral malleolus tenderness found. No medial malleolus, no AITFL, no CF ligament, no posterior TFL, no head of 5th metatarsal and no proximal fibula tenderness found. Achilles tendon exhibits no pain, no defect and normal Thompson's test results.       Left ankle: She exhibits normal range of motion, no swelling, no ecchymosis, no deformity, no laceration and normal pulse. Tenderness (over heel). No lateral malleolus, no medial malleolus, no AITFL, no CF ligament, no posterior TFL, no head of 5th metatarsal and no proximal fibula tenderness found. Achilles tendon exhibits no pain, no defect and normal Thompson's test results.  Neurological: She is alert and oriented to person, place, and time. She has normal reflexes.  Skin: Skin is warm and dry. No rash noted. No erythema. No pallor.   Assessment and Plan :   1. Plantar fasciitis, bilateral 2. Pain in joint, ankle and foot, unspecified laterality 3. Heel pain, unspecified laterality 4. Ankle swelling, unspecified laterality 5. Hip pain, right - Labs pending. Patient's pain and swelling in her ankle have persisted. I will start her on short steroid taper for inflammation. Referral to ortho and chiropractor for her hip pain pending.  Lindsay Eagles, PA-C [facility] [phone] 02/08/2015 1:27 PM

## 2015-02-08 NOTE — Patient Instructions (Signed)
Plantar Fasciitis Plantar fasciitis is a painful foot condition that affects the heel. It occurs when the band of tissue that connects the toes to the heel bone (plantar fascia) becomes irritated. This can happen after exercising too much or doing other repetitive activities (overuse injury). The pain from plantar fasciitis can range from mild irritation to severe pain that makes it difficult for you to walk or move. The pain is usually worse in the morning or after you have been sitting or lying down for a while. CAUSES This condition may be caused by:  Standing for long periods of time.  Wearing shoes that do not fit.  Doing high-impact activities, including running, aerobics, and ballet.  Being overweight.  Having an abnormal way of walking (gait).  Having tight calf muscles.  Having high arches in your feet.  Starting a new athletic activity. SYMPTOMS The main symptom of this condition is heel pain. Other symptoms include:  Pain that gets worse after activity or exercise.  Pain that is worse in the morning or after resting.  Pain that goes away after you walk for a few minutes. DIAGNOSIS This condition may be diagnosed based on your signs and symptoms. Your health care provider will also do a physical exam to check for:  A tender area on the bottom of your foot.  A high arch in your foot.  Pain when you move your foot.  Difficulty moving your foot. You may also need to have imaging studies to confirm the diagnosis. These can include:  X-rays.  Ultrasound.  MRI. TREATMENT  Treatment for plantar fasciitis depends on the severity of the condition. Your treatment may include:  Rest, ice, and over-the-counter pain medicines to manage your pain.  Exercises to stretch your calves and your plantar fascia.  A splint that holds your foot in a stretched, upward position while you sleep (night splint).  Physical therapy to relieve symptoms and prevent problems in the  future.  Cortisone injections to relieve severe pain.  Extracorporeal shock wave therapy (ESWT) to stimulate damaged plantar fascia with electrical impulses. It is often used as a last resort before surgery.  Surgery, if other treatments have not worked after 12 months. HOME CARE INSTRUCTIONS  Take medicines only as directed by your health care provider.  Avoid activities that cause pain.  Roll the bottom of your foot over a bag of ice or a bottle of cold water. Do this for 20 minutes, 3-4 times a day.  Perform simple stretches as directed by your health care provider.  Try wearing athletic shoes with air-sole or gel-sole cushions or soft shoe inserts.  Wear a night splint while sleeping, if directed by your health care provider.  Keep all follow-up appointments with your health care provider. PREVENTION   Do not perform exercises or activities that cause heel pain.  Consider finding low-impact activities if you continue to have problems.  Lose weight if you need to. The best way to prevent plantar fasciitis is to avoid the activities that aggravate your plantar fascia. SEEK MEDICAL CARE IF:  Your symptoms do not go away after treatment with home care measures.  Your pain gets worse.  Your pain affects your ability to move or do your daily activities.   This information is not intended to replace advice given to you by your health care provider. Make sure you discuss any questions you have with your health care provider.   Document Released: 12/18/2000 Document Revised: 12/14/2014 Document Reviewed: 02/02/2014 Elsevier   Interactive Patient Education 2016 Elsevier Inc.  

## 2015-02-09 LAB — SEDIMENTATION RATE: Sed Rate: 3 mm/hr (ref 0–40)

## 2015-02-15 ENCOUNTER — Telehealth: Payer: Self-pay | Admitting: Physician Assistant

## 2015-02-15 NOTE — Telephone Encounter (Signed)
PT would like X-ray results sent from Dr. Kelby Fam office/has been advised that MR release form must be filled out to provide X-ray copy.  [phone] please contact when ready for pick up

## 2015-02-23 NOTE — Telephone Encounter (Signed)
At the patient's request, I made a copy of her x - ray on CD , and I placed in the pick up drawer. 

## 2015-02-28 ENCOUNTER — Ambulatory Visit (INDEPENDENT_AMBULATORY_CARE_PROVIDER_SITE_OTHER): Payer: 59 | Admitting: Physician Assistant

## 2015-02-28 ENCOUNTER — Encounter: Payer: Self-pay | Admitting: Physician Assistant

## 2015-02-28 VITALS — BP 106/80 | HR 84 | Temp 98.1°F | Resp 16 | Ht 67.75 in | Wt 186.2 lb

## 2015-02-28 DIAGNOSIS — L298 Other pruritus: Secondary | ICD-10-CM

## 2015-02-28 DIAGNOSIS — G500 Trigeminal neuralgia: Secondary | ICD-10-CM

## 2015-02-28 DIAGNOSIS — N898 Other specified noninflammatory disorders of vagina: Secondary | ICD-10-CM

## 2015-02-28 DIAGNOSIS — M069 Rheumatoid arthritis, unspecified: Secondary | ICD-10-CM | POA: Insufficient documentation

## 2015-02-28 MED ORDER — FLUCONAZOLE 150 MG PO TABS: 150.0000 mg | ORAL_TABLET | Freq: Once | ORAL | Status: DC

## 2015-02-28 NOTE — Progress Notes (Signed)
Patient ID: Lindsay Barnes, female    DOB: November 14, 1955, 59 y.o.   MRN: [id number]  PCP: Lindsay Barnes  Subjective:   Chief Complaint  Patient presents with  . consultation    HPI Presents for consultation.  Was seen here twice within the past 2 months for cystitis and plantar fasciitis. UTI symptoms resolved, but joint pain persisted. ESR was 3 mmHg/hr. She saw ortho for a second opinion.  Has seen Lindsay Barnes for further evaluation of multiple joint pain. Has been diagnosed with rheumatoid arthritis. Completed a course of prednisone. Unable to stand longer than 90 minutes due to pain. Seeing a chiropractor (Lindsay Barnes a [facility]), who is helping a lot, as well..  Trigeminal neuralgia is now bothering her enough that she's ready to see neurology. Recall that gabapentin caused too much somnolence to take. Needs to get in before the end of the year, when she loses insurance. Lindsay Barnes is in network for her. She contacted them, and they indicated that they could accommodate that.  Had some irregular bleeding during the first month of estradiol-norethindrone. That resolved, but now she has tender nipples. Hot sweats, skin changes and erratic sleep patterns resolved on it, so she's happy to give it longer to see if the nipple symptoms resolve.  Completed treatment for UTI. Now has some vaginal itching. No vaginal discharge. External burning with urination, but no urgency, frequency or pelvic pain.  Has applied for Medicaid. May file for disability.   Review of Systems As above.    Patient Active Problem List   Diagnosis Date Noted  . 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 Trigeminal neuralgia of left side of face 11/29/2014  . Plantar fasciitis, bilateral      Prior to Admission medications   Medication Sig Start Date End Date Taking? Authorizing Provider  cholecalciferol (VITAMIN D) 1000 UNITS tablet Take 1,000 Units by mouth daily.   Yes Historical  Provider, MD  Estradiol-Norethindrone Acet 0.5-0.1 MG per tablet Take 1 tablet by mouth daily. 11/29/14  Yes Lindsay Mankowski, PA-C  fish oil-omega-3 fatty acids 1000 MG capsule Take 2 g by mouth daily.   Yes Historical Provider, MD  Multiple Vitamin (MULTI VITAMIN PO) Take by mouth daily.   Yes Historical Provider, MD  Specialty Vitamins Products (MAGNESIUM, AMINO ACID CHELATE,) 133 MG tablet Take 1 tablet by mouth 2 (two) times daily.   Yes Historical Provider, MD  vitamin C (ASCORBIC ACID) 500 MG tablet Take 500 mg by mouth daily.   Yes Historical Provider, MD     No Known Allergies     Objective:  Physical Exam  Constitutional: She is oriented to person, place, and time. She appears well-developed and well-nourished. No distress.  BP 106/80 mmHg  Pulse 84  Temp(Src) 98.1 F (36.7 C) (Oral)  Resp 16  Ht 5' 7.75" (1.721 m)  Wt 186 lb 3.2 oz (84.46 kg)  BMI 28.52 kg/m2  SpO2 98%   Eyes: Conjunctivae are normal. No scleral icterus.  Neck: No thyromegaly present.  Cardiovascular: Normal rate, regular rhythm, normal heart sounds and intact distal pulses.   Pulmonary/Chest: Effort normal and breath sounds normal.  Lymphadenopathy:    She has no cervical adenopathy.  Neurological: She is alert and oriented to person, place, and time.  Skin: Skin is warm and dry.  Psychiatric: She has a normal mood and affect. Her behavior is normal.           Assessment & Plan:   1. Trigeminal  neuralgia of left side of face Long-standing problem. Now constant. Ready for specialty care. - Ambulatory referral to Neurology  2. Rheumatoid arthritis involving multiple sites, unspecified rheumatoid factor presence (Lindsay Barnes) Continue follow-up with Lindsay Barnes. Will update her record as we receive Lindsay Barnes notes.  3. Vaginal itching Likely yeast vaginitis secondary to recent antibiotic treatment for cystitis. If symptoms persist after diflucan, plan vaginal exam. - fluconazole (DIFLUCAN) 150 MG  tablet; Take 1 tablet (150 mg total) by mouth once. Repeat if needed  Dispense: 2 tablet; Refill: 0   Lindsay Chute, PA-C Physician Assistant-Certified [facility]

## 2015-02-28 NOTE — Patient Instructions (Signed)
Purchase clotrimazole cream over the counter to use externally if you need it for itching.

## 2015-03-09 ENCOUNTER — Ambulatory Visit (INDEPENDENT_AMBULATORY_CARE_PROVIDER_SITE_OTHER): Payer: 59 | Admitting: Neurology

## 2015-03-09 ENCOUNTER — Encounter: Payer: Self-pay | Admitting: Neurology

## 2015-03-09 VITALS — BP 125/84 | HR 76 | Ht 67.5 in | Wt 189.0 lb

## 2015-03-09 DIAGNOSIS — G500 Trigeminal neuralgia: Secondary | ICD-10-CM | POA: Diagnosis not present

## 2015-03-09 NOTE — Progress Notes (Signed)
[facility]    Provider:  Dr Jaynee Eagles Referring Provider: Harrison Mons, PA-C Primary Care Physician:  JEFFERY,CHELLE, PA-C  CC:  Trigeminal neuralgia  HPI:  Lindsay Barnes is a 59 y.o. female here as a referral from Dr. Jacqulynn Cadet for trigeminal neuralgia. Past medical history of depression and high cholesterol, RA. Symptoms started 7 years ago. She was touching her face and she had a shocking feeling on the left face when it first happned. No inciting eb=vents, no trauma, just happened one day. The pain is in the maxillary area of the face. It radiates on the face up to the eye. It has moved to encompass the whole area and in the whole cheek and under the eye. It is triggered by light touch, chewing. It would subside for months then come back. For 3 years it has been very painful. More persistent. She was at work talking to someone and she would feel it and it would hurt and she would just have to "quiet" her face. Electric shock. Worsened last winter. Wind can trigger it. Happens multiple times a day. No lacrimation or rhinorhea. Neurontin was too sedating for her. Cymbalta did not tolerate either. She is having emotional moments, she is crying in the office today. She quit her job recently.   Reviewed notes, labs and imaging from outside physicians, which showed: Sed rate 3  Review of Systems: Patient complains of symptoms per HPI as well as the following symptoms: Depression, memory loss, frequent infections. Pertinent negatives per HPI. All others negative.   Social History   Social History  . Marital Status: Single    Spouse Name: n/a  . Number of Children: 1  . Years of Education: 16   Occupational History  . unemployed    Social History Main Topics  . Smoking status: Former Smoker -- 1.00 packs/day for 30 years    Quit date: 04/16/2012  . Smokeless tobacco: Current User     Comment: vapes  . Alcohol Use: 1.2 oz/week    2 Glasses of wine per week  . Drug Use:  No  . Sexual Activity:    Partners: Male     Comment: not sexually active since 2010 (11/2014)   Other Topics Concern  . Not on file   Social History Narrative   Lives with daughter and daughter's boyfriend and their baby   Caffeine use: none    Family History  Problem Relation Age of Onset  . Arthritis Mother   . Osteopenia Mother   . Heart disease Father     s/p 4V CABG  . Heart disease Maternal Grandmother   . Hypertension Sister   . Colon cancer Neg Hx     Past Medical History  Diagnosis Date  . Plantar fasciitis, bilateral   . Depression   . High cholesterol   . Degenerative arthritis     Past Surgical History  Procedure Laterality Date  . Cesarean section  1986  . Cervical cone biopsy  1997    Current Outpatient Prescriptions  Medication Sig Dispense Refill  . cholecalciferol (VITAMIN D) 1000 UNITS tablet Take 1,000 Units by mouth daily.    . Estradiol-Norethindrone Acet 0.5-0.1 MG per tablet Take 1 tablet by mouth daily. 90 tablet 3  . fish oil-omega-3 fatty acids 1000 MG capsule Take 2 g by mouth daily.    . Multiple Vitamin (MULTI VITAMIN PO) Take by mouth daily.    Marland Kitchen Specialty Vitamins Products (MAGNESIUM, AMINO ACID CHELATE,) 133 MG tablet  Take 1 tablet by mouth 2 (two) times daily.    . vitamin C (ASCORBIC ACID) 500 MG tablet Take 500 mg by mouth daily.     No current facility-administered medications for this visit.    Allergies as of 03/09/2015  . (No Known Allergies)    Vitals: BP 125/84 mmHg  Pulse 76  Ht 5' 7.5" (1.715 m)  Wt 189 lb (85.73 kg)  BMI 29.15 kg/m2 Last Weight:  Wt Readings from Last 1 Encounters:  03/09/15 189 lb (85.73 kg)   Last Height:   Ht Readings from Last 1 Encounters:  03/09/15 5' 7.5" (1.715 m)    Physical exam: Exam: Gen: NAD, conversant, well nourised, overweight, well groomed                     CV: RRR, no MRG. No Carotid Bruits. No peripheral edema, warm, nontender Eyes: Conjunctivae clear without  exudates or hemorrhage  Neuro: Detailed Neurologic Exam  Speech:    Speech is normal; fluent and spontaneous with normal comprehension.  Cognition:    The patient is oriented to person, place, and time;     recent and remote memory intact;     language fluent;     normal attention, concentration,     fund of knowledge Cranial Nerves:    The pupils are equal, round, and reactive to light. The fundi are normal and spontaneous venous pulsations are present. Visual fields are full to finger confrontation. Extraocular movements are intact. Trigeminal sensation is intact and the muscles of mastication are normal. The face is symmetric. The palate elevates in the midline. Hearing intact. Voice is normal. Shoulder shrug is normal. The tongue has normal motion without fasciculations.   Coordination:    Normal finger to nose and heel to shin. Normal rapid alternating movements.   Gait:    Heel-toe and tandem gait are normal.   Motor Observation:    No asymmetry, no atrophy, and no involuntary movements noted. Tone:    Normal muscle tone.    Posture:    Posture is normal. normal erect    Strength:    Strength is V/V in the upper and lower limbs.      Sensation: intact to LT     Reflex Exam:  DTR's:    Deep tendon reflexes in the upper and lower extremities are normal bilaterally.   Toes:    The toes are downgoing bilaterally.   Clonus:    Clonus is absent.      Assessment/Plan:  59 year old female with trigeminal neuralgia. She does not want to take oral medications. Suggest trigeminal nerve blocks. MRI of the brain with trigeminal protocol. For her depression I encouraged therapy.   Sarina Ill, MD  [facility] [address] [place], [place] [address]  Phone [phone] Fax [phone]

## 2015-03-09 NOTE — Patient Instructions (Signed)
Remember to drink plenty of fluid, eat healthy meals and do not skip any meals. Try to eat protein with a every meal and eat a healthy snack such as fruit or nuts in between meals. Try to keep a regular sleep-wake schedule and try to exercise daily, particularly in the form of walking, 20-30 minutes a day, if you can.   As far as your medications are concerned, I would like to suggest: Nerve blocks  As far as diagnostic testing: MRi of the brain  I would like to see you back for nerve blocks, sooner if we need to. Please call us with any interim questions, concerns, problems, updates or refill requests.   Our phone number is [phone]. We also have an after hours call service for urgent matters and there is a physician on-call for urgent questions. For any emergencies you know to call 911 or go to the nearest emergency room

## 2015-03-12 ENCOUNTER — Encounter: Payer: Self-pay | Admitting: Neurology

## 2015-03-16 ENCOUNTER — Ambulatory Visit (INDEPENDENT_AMBULATORY_CARE_PROVIDER_SITE_OTHER): Payer: 59 | Admitting: Neurology

## 2015-03-16 VITALS — BP 130/78 | HR 73 | Wt 190.2 lb

## 2015-03-16 DIAGNOSIS — G500 Trigeminal neuralgia: Secondary | ICD-10-CM

## 2015-03-17 ENCOUNTER — Telehealth: Payer: Self-pay | Admitting: Neurology

## 2015-03-17 NOTE — Telephone Encounter (Signed)
Pt called sts she had nerve block yesterday. After 2 hrs she started with pain again. By 9pm it was like she had never had nerve block. She was calling to give update.

## 2015-03-17 NOTE — Progress Notes (Signed)
    NERVE BLOCK PROCEDURE NOTE  History:   Procedure: Patient was consented for left-sided trigeminal nerve blocks. A solution containing 2% lidocaine was prepared in a 3-CC syringe with 30 gauge 1/2 inch needle.   6 Target areas in the temporal and trigeminal dermatomal regions were identified via palpation and pain response. The sites were sterilized with alcohol wipes. 0.27ml was injected at each site. The contents of each syringe was injected in a fanlike fashion. The neuralgia improved from 8/10 to 0/10. Patient tolerated the procedure well and no complications were noted.    Consent was provided below and patient acknowledged understanding:   idocaine 2% Lot: [id number] Expiration: 02/2018 NDC: [id number]*  What to expect afterwards?  Immediately after the injection,  your head may feel warm and numb. You may also experience reduction in the pain. The local anaesthetic wears off in a few hours.  The pain relief is vary variable and can last from a few days to several months. Some patients do not experience any pain relief. Hence it is difficult to predict the outcome of the injection treatment in a particular patient.   There may be some discomfort at the injection site for a couple of days after treatment, however, this should settle quite quickly. We advise you to take things easy for the rest of the day. Continue taking your pain medication as advised by your consultant or until you feel benefit from the treatment.   What are the side effects / complications? Reviewed with patient: Common   Soreness / bruising at the injection site.   Temporary increase (up to 7 days) in pain following procedure.   Rare   Bleeding   Infection at the injection site   Allergic reaction   New pain   Worsening pain

## 2015-03-18 NOTE — Telephone Encounter (Signed)
Lindsay Barnes - when will we have the bupivicaine/Marcaine back in stock?

## 2015-03-22 NOTE — Telephone Encounter (Signed)
Thanks! Lindsay Barnes, we can schedule patient for another nerve block if she wants to try it. Schedule her at noon so we don't take up a patient slot. We can use the short and long acting nerve block agents this time. See if she is willing to try thanks

## 2015-03-22 NOTE — Telephone Encounter (Signed)
I apologize for the delay, I have been out of the office.  It will arrive this afternoon.  Thank you.

## 2015-03-22 NOTE — Telephone Encounter (Signed)
Called and LVM relaying Dr Jaynee Eagles message below. Asked her to call back to let us know what she would like to do.

## 2015-03-23 ENCOUNTER — Ambulatory Visit (INDEPENDENT_AMBULATORY_CARE_PROVIDER_SITE_OTHER): Payer: 59 | Admitting: Neurology

## 2015-03-23 ENCOUNTER — Encounter: Payer: Self-pay | Admitting: Neurology

## 2015-03-23 VITALS — BP 126/81 | HR 71 | Wt 192.6 lb

## 2015-03-23 DIAGNOSIS — G500 Trigeminal neuralgia: Secondary | ICD-10-CM

## 2015-03-23 NOTE — Progress Notes (Signed)
Nerve Block:  Lidocaine 2%: 63mL total Lot: [id number] Expiration: 02/2018 NDC: [id number]  Bupivacaine 0.5%- 1mL total Lot: [id number] Expiration: 11/2016 NDC: [id number]

## 2015-03-23 NOTE — Telephone Encounter (Signed)
Pt called back. She would like to try and come in for nerve block today at 12pm. She is going to check in at 1145am. Offered tomorrow at 8am but she declined.

## 2015-03-23 NOTE — Progress Notes (Signed)
NERVE BLOCK PROCEDURE NOTE  History:   Procedure: Patient was consented for left-sided trigeminal nerve blocks. A solution containing 2% lidocaine ans 0.5% Bupivicaine was prepared in 2x 3-CC syringes with 30 gauge 1/2 inch needle.   6 Target areas in the temporal and trigeminal dermatomal regions were identified via palpation and pain response. The sites were sterilized with alcohol wipes. 78ml was injected at each site. The contents of each syringe was injected in a fanlike fashion. The neuralgia improved from 8/10 to 2/10. Patient tolerated the procedure well and no complications were noted.    Consent was provided below and patient acknowledged understanding:    Lidocaine 2%: 76mL total  Lot: [id number]  Expiration: 02/2018  NDC: [id number]   Bupivacaine 0.5%- 33mL total  Lot: [id number]  Expiration: 11/2016  NDC: [id number]   What to expect afterwards?  Immediately after the injection, your head may feel warm and numb. You may also experience reduction in the pain. The local anaesthetic wears off in a few hours.  The pain relief is vary variable and can last from a few days to several months. Some patients do not experience any pain relief. Hence it is difficult to predict the outcome of the injection treatment in a particular patient.   There may be some discomfort at the injection site for a couple of days after treatment, however, this should settle quite quickly. We advise you to take things easy for the rest of the day. Continue taking your pain medication as advised by your consultant or until you feel benefit from the treatment.   What are the side effects / complications? Reviewed with patient: Common   Soreness / bruising at the injection site.   Temporary increase (up to 7 days) in pain following procedure.   Rare   Bleeding   Infection at the injection site   Allergic reaction   New pain   Worsening pain

## 2015-03-29 ENCOUNTER — Ambulatory Visit (INDEPENDENT_AMBULATORY_CARE_PROVIDER_SITE_OTHER): Payer: 59

## 2015-03-29 DIAGNOSIS — G500 Trigeminal neuralgia: Secondary | ICD-10-CM | POA: Diagnosis not present

## 2015-03-29 MED ORDER — GADOPENTETATE DIMEGLUMINE 469.01 MG/ML IV SOLN: 18.0000 mL | Freq: Once | INTRAVENOUS | Status: AC | PRN

## 2015-04-05 ENCOUNTER — Telehealth: Payer: Self-pay | Admitting: *Deleted

## 2015-04-05 NOTE — Telephone Encounter (Signed)
-----   Message from Melvenia Beam, MD sent at 04/02/2015 11:36 AM EST ----- MRi of the brain was within normal limits for age. No cause for her facial pain was seen. thanks

## 2015-04-05 NOTE — Telephone Encounter (Signed)
I called and LMVM (home ok per DPR) that her MRI brain was normal, no cause for facial pain seen.   She is to call back if questions.

## 2015-04-06 ENCOUNTER — Ambulatory Visit (INDEPENDENT_AMBULATORY_CARE_PROVIDER_SITE_OTHER): Payer: 59 | Admitting: Neurology

## 2015-04-06 ENCOUNTER — Encounter: Payer: Self-pay | Admitting: Neurology

## 2015-04-06 VITALS — BP 142/93 | HR 76 | Ht 67.5 in | Wt 193.2 lb

## 2015-04-06 DIAGNOSIS — G500 Trigeminal neuralgia: Secondary | ICD-10-CM | POA: Diagnosis not present

## 2015-04-06 MED ORDER — GABAPENTIN 100 MG PO CAPS: 100.0000 mg | ORAL_CAPSULE | Freq: Three times a day (TID) | ORAL | Status: DC

## 2015-04-06 NOTE — Progress Notes (Signed)
[facility]    Provider:  Dr Jaynee Eagles Referring Provider: Harrison Mons, PA-C Primary Care Physician:  JEFFERY,CHELLE, PA-C  CC: neuralgia  Interval History 04/06/2015: the nerve blocks did not help. Discussed other treatments for her facial neuralgia. Appears to be more localized to the infraorbital nerve. Decided to try Neurontin very low dose. Also discussed MRI of the brain below show patient images.  MRI of the brain 03/29/2015.:  Mildly abnormal MRI brain (with and without) demonstrating: 1. Few scattered periventricular and subcortical foci of non-specific T2 hyperintensities. No abnormal lesions are seen on post contrast views. These findings are non-specific and considerations include autoimmune, inflammatory, post-infectious, microvascular ischemic or migraine associated etiologies.  2. Thin cut views of trigeminal nerve courses show no abnormal inflammatory or compressive lesions.   HPI: Lindsay Barnes is a 59 y.o. female here as a referral from Dr. Jacqulynn Cadet for trigeminal neuralgia. Past medical history of depression and high cholesterol, RA. Symptoms started 7 years ago. She was touching her face and she had a shocking feeling on the left face when it first happned. No inciting eb=vents, no trauma, just happened one day. The pain is in the maxillary area of the face. It radiates on the face up to the eye. It has moved to encompass the whole area and in the whole cheek and under the eye. It is triggered by light touch, chewing. It would subside for months then come back. For 3 years it has been very painful. More persistent. She was at work talking to someone and she would feel it and it would hurt and she would just have to "quiet" her face. Electric shock. Worsened last winter. Wind can trigger it. Happens multiple times a day. No lacrimation or rhinorhea. Neurontin was too sedating for her. Cymbalta did not tolerate either. She is having emotional moments, she is  crying in the office today. She quit her job recently.   Reviewed notes, labs and imaging from outside physicians, which showed: Sed rate 3  Review of Systems: Patient complains of symptoms per HPI as well as the following symptoms: Depression, memory loss, frequent infections. Pertinent negatives per HPI. All others negative.    Social History   Social History  . Marital Status: Single    Spouse Name: n/a  . Number of Children: 1  . Years of Education: 16   Occupational History  . unemployed    Social History Main Topics  . Smoking status: Former Smoker -- 1.00 packs/day for 30 years    Quit date: 04/16/2012  . Smokeless tobacco: Current User     Comment: vapes  . Alcohol Use: 1.2 oz/week    2 Glasses of wine per week  . Drug Use: No  . Sexual Activity:    Partners: Male     Comment: not sexually active since 2010 (11/2014)   Other Topics Concern  . Not on file   Social History Narrative   Lives with daughter and daughter's boyfriend and their baby   Caffeine use: none    Family History  Problem Relation Age of Onset  . Arthritis Mother   . Osteopenia Mother   . Heart disease Father     s/p 4V CABG  . Heart disease Maternal Grandmother   . Hypertension Sister   . Colon cancer Neg Hx   . Migraines Neg Hx     Past Medical History  Diagnosis Date  . Plantar fasciitis, bilateral   . Depression   . High cholesterol   .  Degenerative arthritis     Past Surgical History  Procedure Laterality Date  . Cesarean section  1986  . Cervical cone biopsy  1997    Current Outpatient Prescriptions  Medication Sig Dispense Refill  . cholecalciferol (VITAMIN D) 1000 UNITS tablet Take 1,000 Units by mouth daily.    . Estradiol-Norethindrone Acet 0.5-0.1 MG per tablet Take 1 tablet by mouth daily. 90 tablet 3  . fish oil-omega-3 fatty acids 1000 MG capsule Take 2 g by mouth daily.    . Multiple Vitamin (MULTI VITAMIN PO) Take by mouth daily.    Marland Kitchen Specialty Vitamins  Products (MAGNESIUM, AMINO ACID CHELATE,) 133 MG tablet Take 1 tablet by mouth 2 (two) times daily.    . vitamin C (ASCORBIC ACID) 500 MG tablet Take 500 mg by mouth daily.     No current facility-administered medications for this visit.   Facility-Administered Medications Ordered in Other Visits  Medication Dose Route Frequency Provider Last Rate Last Dose  . gadopentetate dimeglumine (MAGNEVIST) injection 18 mL  18 mL Intravenous Once PRN Melvenia Beam, MD        Allergies as of 04/06/2015  . (No Known Allergies)    Vitals: BP 142/93 mmHg  Pulse 76  Ht 5' 7.5" (1.715 m)  Wt 193 lb 3.2 oz (87.635 kg)  BMI 29.80 kg/m2 Last Weight:  Wt Readings from Last 1 Encounters:  04/06/15 193 lb 3.2 oz (87.635 kg)   Last Height:   Ht Readings from Last 1 Encounters:  04/06/15 5' 7.5" (1.715 m)    Speech:  Speech is normal; fluent and spontaneous with normal comprehension.  Cognition:  The patient is oriented to person, place, and time;   recent and remote memory intact;   language fluent;   normal attention, concentration,   fund of knowledge Cranial Nerves:  The pupils are equal, round, and reactive to light. The fundi are normal and spontaneous venous pulsations are present. Visual fields are full to finger confrontation. Extraocular movements are intact. Trigeminal sensation is intact and the muscles of mastication are normal. The face is symmetric. The palate elevates in the midline. Hearing intact. Voice is normal. Shoulder shrug is normal. The tongue has normal motion without fasciculations.   Coordination:  Normal finger to nose and heel to shin. Normal rapid alternating movements.   Gait:  Heel-toe and tandem gait are normal.   Motor Observation:  No asymmetry, no atrophy, and no involuntary movements noted. Tone:  Normal muscle tone.   Posture:  Posture is normal. normal erect   Strength:  Strength is V/V in the upper and lower  limbs.    Sensation: intact to LT   Reflex Exam:  DTR's:  Deep tendon reflexes in the upper and lower extremities are normal bilaterally.  Toes:  The toes are downgoing bilaterally.  Clonus:  Clonus is absent.     Assessment/Plan: 60 year old female with neuralgia of the face in the area of the infraorbital nerve. Nerve block did not help. MRI of the brain was unrevealing for any cause. We'll try very low dose Neurontin  Sarina Ill, MD  [facility] [address] [place],  [address]  Phone [phone] Fax [phone]  A total of 30 minutes was spent face-to-face with this patient. Over half this time was spent on counseling patient on the neuralgia diagnosis and different diagnostic and therapeutic options available.

## 2015-04-06 NOTE — Patient Instructions (Signed)
Overall you are doing fairly well but I do want to suggest a few things today:   Remember to drink plenty of fluid, eat healthy meals and do not skip any meals. Try to eat protein with a every meal and eat a healthy snack such as fruit or nuts in between meals. Try to keep a regular sleep-wake schedule and try to exercise daily, particularly in the form of walking, 20-30 minutes a day, if you can.   As far as your medications are concerned, I would like to suggest: neurontin/gabapentin 100mg  up to 3x a day  I would like to see you back as needed, sooner if we need to. Please call us with any interim questions, concerns, problems, updates or refill requests.    Our phone number is [phone]. We also have an after hours call service for urgent matters and there is a physician on-call for urgent questions. For any emergencies you know to call 911 or go to the nearest emergency room

## 2015-04-07 DIAGNOSIS — G500 Trigeminal neuralgia: Secondary | ICD-10-CM | POA: Insufficient documentation

## 2015-06-05 DIAGNOSIS — Z0271 Encounter for disability determination: Secondary | ICD-10-CM

## 2015-08-10 ENCOUNTER — Ambulatory Visit (INDEPENDENT_AMBULATORY_CARE_PROVIDER_SITE_OTHER): Payer: Self-pay | Admitting: Family Medicine

## 2015-08-10 VITALS — BP 118/72 | HR 86 | Temp 98.4°F | Resp 18 | Ht 68.0 in | Wt 196.0 lb

## 2015-08-10 DIAGNOSIS — J209 Acute bronchitis, unspecified: Secondary | ICD-10-CM

## 2015-08-10 DIAGNOSIS — R05 Cough: Secondary | ICD-10-CM

## 2015-08-10 DIAGNOSIS — R059 Cough, unspecified: Secondary | ICD-10-CM

## 2015-08-10 MED ORDER — ALBUTEROL SULFATE 108 (90 BASE) MCG/ACT IN AEPB: 2.0000 | INHALATION_SPRAY | Freq: Four times a day (QID) | RESPIRATORY_TRACT | Status: AC | PRN

## 2015-08-10 MED ORDER — HYDROCODONE-HOMATROPINE 5-1.5 MG/5ML PO SYRP: 5.0000 mL | ORAL_SOLUTION | Freq: Three times a day (TID) | ORAL | Status: DC | PRN

## 2015-08-10 MED ORDER — PREDNISONE 20 MG PO TABS: ORAL_TABLET | ORAL | Status: DC

## 2015-08-10 NOTE — Patient Instructions (Addendum)

## 2015-08-10 NOTE — Progress Notes (Signed)
Patient ID: Lindsay Barnes MRN: [id number], DOB: 14-Jan-1956, 60 y.o. Date of Encounter: 08/10/2015, 1:56 PM  Primary Physician: Harrison Mons, PA-C  Chief Complaint:  Chief Complaint  Patient presents with  . Cough    x 3 weeks  . Wheezing  . Generalized Body Aches    HPI: 60 y.o. year old female  Unemployed woman who presents with a 21 day history of nasal congestion, preceded by post nasal drip, sore throat, and cough. Mild sinus pressure. Afebrile. No chills. Nasal congestion absent. Cough is quite violent and associated with some expiratory wheezing, and not associated with time of day.  Has tried OTC cold preps without success. No GI complaints.   No sick contacts, recent antibiotics, or recent travels.   No leg trauma, sedentary periods, h/o cancer, or tobacco use.  Past Medical History  Diagnosis Date  . Plantar fasciitis, bilateral   . Depression   . High cholesterol   . Degenerative arthritis      Home Meds: Prior to Admission medications   Medication Sig Start Date End Date Taking? Authorizing Provider  cholecalciferol (VITAMIN D) 1000 UNITS tablet Take 1,000 Units by mouth daily.   Yes Historical Provider, MD  Estradiol-Norethindrone Acet 0.5-0.1 MG per tablet Take 1 tablet by mouth daily. 11/29/14  Yes Chelle Jeffery, PA-C  fish oil-omega-3 fatty acids 1000 MG capsule Take 2 g by mouth daily.   Yes Historical Provider, MD  gabapentin (NEURONTIN) 100 MG capsule Take 1 capsule (100 mg total) by mouth 3 (three) times daily. 04/06/15  Yes Melvenia Beam, MD  Multiple Vitamin (MULTI VITAMIN PO) Take by mouth daily.   Yes Historical Provider, MD  Specialty Vitamins Products (MAGNESIUM, AMINO ACID CHELATE,) 133 MG tablet Take 1 tablet by mouth 2 (two) times daily.   Yes Historical Provider, MD  vitamin C (ASCORBIC ACID) 500 MG tablet Take 500 mg by mouth daily.   Yes Historical Provider, MD    Allergies: No Known Allergies  Social History   Social History  . Marital  Status: Single    Spouse Name: n/a  . Number of Children: 1  . Years of Education: 16   Occupational History  . unemployed    Social History Main Topics  . Smoking status: Former Smoker -- 1.00 packs/day for 30 years    Quit date: 04/16/2012  . Smokeless tobacco: Current User     Comment: vapes  . Alcohol Use: 1.2 oz/week    2 Glasses of wine per week  . Drug Use: No  . Sexual Activity:    Partners: Male     Comment: not sexually active since 2010 (11/2014)   Other Topics Concern  . Not on file   Social History Narrative   Lives with daughter and daughter's boyfriend and their baby   Caffeine use: none     Review of Systems: Constitutional: negative for chills, fever, night sweats or weight changes Cardiovascular: negative for chest pain or palpitations Respiratory: negative for hemoptysis, wheezing, or shortness of breath Abdominal: negative for abdominal pain, nausea, vomiting or diarrhea Dermatological: negative for rash Neurologic: negative for headache   Physical Exam: Blood pressure 118/72, pulse 86, temperature 98.4 F (36.9 C), resp. rate 18, height 5\' 8"  (1.727 m), weight 196 lb (88.905 kg), SpO2 95 %., Body mass index is 29.81 kg/(m^2). General: Well developed, well nourished, in no acute distress. Head: Normocephalic, atraumatic, eyes without discharge, sclera non-icteric, nares are congested. Bilateral auditory canals clear, TM's are without perforation,  pearly grey with reflective cone of light bilaterally. No sinus TTP. Oral cavity moist, dentition normal. Posterior pharynx with post nasal drip and mild erythema. No peritonsillar abscess or tonsillar exudate. Neck: Supple. No thyromegaly. Full ROM. No lymphadenopathy. Lungs: Coarse breath sounds bilaterally without wheezes, rales, or rhonchi. Breathing is unlabored.  Heart: RRR with S1 S2. No murmurs, rubs, or gallops appreciated. Msk:  Strength and tone normal for age. Extremities: No clubbing or cyanosis.  No edema. Neuro: Alert and oriented X 3. Moves all extremities spontaneously. CNII-XII grossly in tact. Psych:  Responds to questions appropriately with a normal affect.     ASSESSMENT AND PLAN:  60 y.o. year old female with bronchitis. -   ICD-9-CM ICD-10-CM   1. Cough 786.2 R05 HYDROcodone-homatropine (HYCODAN) 5-1.5 MG/5ML syrup     Albuterol Sulfate (PROAIR RESPICLICK) 123XX123 (90 Base) MCG/ACT AEPB     predniSONE (DELTASONE) 20 MG tablet  2. Acute bronchitis, unspecified organism 466.0 J20.9 HYDROcodone-homatropine (HYCODAN) 5-1.5 MG/5ML syrup     Albuterol Sulfate (PROAIR RESPICLICK) 123XX123 (90 Base) MCG/ACT AEPB     predniSONE (DELTASONE) 20 MG tablet   -Tylenol/Motrin prn -Rest/fluids -RTC precautions -RTC 3-5 days if no improvement  Signed, Robyn Haber, MD 08/10/2015 1:56 PM   Signed, Robyn Haber, MD

## 2015-08-22 ENCOUNTER — Ambulatory Visit (INDEPENDENT_AMBULATORY_CARE_PROVIDER_SITE_OTHER): Payer: Self-pay | Admitting: Family Medicine

## 2015-08-22 ENCOUNTER — Ambulatory Visit (INDEPENDENT_AMBULATORY_CARE_PROVIDER_SITE_OTHER): Payer: Self-pay

## 2015-08-22 VITALS — BP 122/90 | HR 85 | Temp 98.0°F | Resp 15 | Ht 68.0 in | Wt 196.0 lb

## 2015-08-22 DIAGNOSIS — R05 Cough: Secondary | ICD-10-CM

## 2015-08-22 DIAGNOSIS — K648 Other hemorrhoids: Secondary | ICD-10-CM

## 2015-08-22 DIAGNOSIS — R140 Abdominal distension (gaseous): Secondary | ICD-10-CM

## 2015-08-22 DIAGNOSIS — R059 Cough, unspecified: Secondary | ICD-10-CM

## 2015-08-22 DIAGNOSIS — J209 Acute bronchitis, unspecified: Secondary | ICD-10-CM

## 2015-08-22 DIAGNOSIS — N750 Cyst of Bartholin's gland: Secondary | ICD-10-CM

## 2015-08-22 DIAGNOSIS — K644 Residual hemorrhoidal skin tags: Secondary | ICD-10-CM

## 2015-08-22 LAB — POCT CBC
Granulocyte percent: 58.5 %G (ref 37–80)
HEMATOCRIT: 43.4 % (ref 37.7–47.9)
Hemoglobin: 15.3 g/dL (ref 12.2–16.2)
LYMPH, POC: 3.5 — AB (ref 0.6–3.4)
MCH, POC: 30.4 pg (ref 27–31.2)
MCHC: 35.4 g/dL (ref 31.8–35.4)
MCV: 85.8 fL (ref 80–97)
MID (CBC): 0.9 (ref 0–0.9)
MPV: 7.8 fL (ref 0–99.8)
PLATELET COUNT, POC: 276 10*3/uL (ref 142–424)
POC Granulocyte: 6.3 (ref 2–6.9)
POC LYMPH %: 33.1 % (ref 10–50)
POC MID %: 8.4 %M (ref 0–12)
RBC: 5.05 M/uL (ref 4.04–5.48)
RDW, POC: 13.4 %
WBC: 10.7 10*3/uL — AB (ref 4.6–10.2)

## 2015-08-22 LAB — POCT SEDIMENTATION RATE: POCT SED RATE: 14 mm/hr (ref 0–22)

## 2015-08-22 MED ORDER — AZITHROMYCIN 250 MG PO TABS: ORAL_TABLET | ORAL | Status: DC

## 2015-08-22 MED ORDER — HYDROCODONE-HOMATROPINE 5-1.5 MG/5ML PO SYRP: 5.0000 mL | ORAL_SOLUTION | Freq: Three times a day (TID) | ORAL | Status: AC | PRN

## 2015-08-22 MED ORDER — FLUCONAZOLE 150 MG PO TABS: 150.0000 mg | ORAL_TABLET | Freq: Once | ORAL | Status: AC

## 2015-08-22 NOTE — Progress Notes (Addendum)
Subjective:  By signing my name below, I, [name], attest that this documentation has been prepared under the direction and in the presence of Delman Cheadle, MD. Electronically Signed: [name], [place]. 08/22/2015 , 3:09 PM .  Patient was seen in Room 12 .   Patient ID: Lindsay Barnes, female    DOB: July 22, 1955, 60 y.o.   MRN: [id number] Chief Complaint  Patient presents with  . Follow-up    x 1 month,cough pt worried gets worst   . Fatigue   HPI Lindsay Barnes is a 60 y.o. female who presents to [facility] for follow up.  She was seen 12 days ago by Dr. Joseph Art with 3 weeks of symptoms, including postnasal drip, sore throat, cough; diagnosed with bronchitis. She was treated with prednisone 3m a day x5 days, hycodan cough syrup and albuterol inhaler.  She did not fill the albuterol inhaler due to cost.   Patient states feeling fatigue and cough with wheezing ongoing for over a month and is worried that it's gotten worse. She mentions having similar symptoms in Nov 2016. At that time, it worsened with sinusitis into Dec 2016. She states it lasted about 6 weeks until it resolved. She did try some mucinex over the winter and a decongestant as well.  She notes taking half of the cough syrup dose to make it last. She denies relief from the prednisone, and rather feels bloated after taking the prednisone.    She also reports having some constipation and had to push a little more than usual; feels like there's a bump now.  She had a colonoscopy that was normal with hyperplastic polyp in Sept 2016, done by Dr. PHilarie Fredrickson  She thinks she might have had a moldy strawberry and chunky milk (that didn't taste funny) which could be responsible for her GI sxs.   She has increased her probiotics and eating sauerkraut to combat this.  She also normal ESR in Nov 2016, when concerned with multiple joint pains.  She had normal thyroid panel in August.     Past Medical History  Diagnosis Date  . Plantar  fasciitis, bilateral   . Depression   . High cholesterol   . Degenerative arthritis    Prior to Admission medications   Medication Sig Start Date End Date Taking? Authorizing Provider  Albuterol Sulfate (PROAIR RESPICLICK) 1657(90 Base) MCG/ACT AEPB Inhale 2 puffs into the lungs every 6 (six) hours as needed. 08/10/15   KRobyn Haber MD  cholecalciferol (VITAMIN D) 1000 UNITS tablet Take 1,000 Units by mouth daily.    Historical Provider, MD  Estradiol-Norethindrone Acet 0.5-0.1 MG per tablet Take 1 tablet by mouth daily. 11/29/14   Chelle Jeffery, PA-C  fish oil-omega-3 fatty acids 1000 MG capsule Take 2 g by mouth daily.    Historical Provider, MD  gabapentin (NEURONTIN) 100 MG capsule Take 1 capsule (100 mg total) by mouth 3 (three) times daily. 04/06/15   AMelvenia Beam MD  HYDROcodone-homatropine ([place] 5-1.5 MG/5ML syrup Take 5 mLs by mouth every 8 (eight) hours as needed for cough. 08/10/15   KRobyn Haber MD  Multiple Vitamin (MULTI VITAMIN PO) Take by mouth daily.    Historical Provider, MD  predniSONE (DELTASONE) 20 MG tablet Two daily with food 08/10/15   KRobyn Haber MD  Specialty Vitamins Products (MAGNESIUM, AMINO ACID CHELATE,) 133 MG tablet Take 1 tablet by mouth 2 (two) times daily.    Historical Provider, MD  vitamin C (ASCORBIC ACID) 500 MG tablet Take  500 mg by mouth daily.    Historical Provider, MD   No Known Allergies  Review of Systems  Constitutional: Positive for fatigue. Negative for fever and chills.  HENT: Negative for sinus pressure and sore throat.   Respiratory: Positive for cough and wheezing. Negative for shortness of breath.   Gastrointestinal: Positive for constipation and abdominal distention. Negative for nausea, vomiting and diarrhea.  Psychiatric/Behavioral: The patient is nervous/anxious.        Objective:   Physical Exam  Constitutional: She is oriented to person, place, and time. She appears well-developed and well-nourished. No  distress.  HENT:  Head: Normocephalic and atraumatic.  Right Ear: A middle ear effusion is present.  Left Ear: A middle ear effusion is present.  Clear postnasal drip  Eyes: EOM are normal. Pupils are equal, round, and reactive to light.  Neck: Neck supple. No thyromegaly present.  Cardiovascular: Normal rate, regular rhythm, S1 normal, S2 normal and normal heart sounds.   No murmur heard. Pulmonary/Chest: Effort normal. No respiratory distress. She has rales (inspiratory) in the right lower field.  Abdominal: She exhibits distension (mild). Bowel sounds are increased. There is no hepatosplenomegaly. There is no CVA tenderness and negative Murphy's sign.  Hyperactive, tympanitic bowel sounds  Musculoskeletal: Normal range of motion.  Lymphadenopathy:       Head (right side): Submandibular adenopathy present. No preauricular and no occipital adenopathy present.       Head (left side): Submandibular adenopathy present. No preauricular and no occipital adenopathy present.    She has no cervical adenopathy.    She has no axillary adenopathy.       Right: No supraclavicular and no epitrochlear adenopathy present.       Left: No supraclavicular and no epitrochlear adenopathy present.  Neurological: She is alert and oriented to person, place, and time.  Skin: Skin is warm and dry.  Psychiatric: She has a normal mood and affect. Her behavior is normal.  Nursing note and vitals reviewed.   BP 122/90 mmHg  Pulse 85  Temp(Src) 98 F (36.7 C) (Oral)  Resp 15  Ht _0  (1.727 m)  Wt 196 lb (88.905 kg)  BMI 29.81 kg/m2  SpO2 94%    Results for orders placed or performed in visit on 08/22/15  POCT CBC  Result Value Ref Range   WBC 10.7 (A) 4.6 - 10.2 K/uL   Lymph, poc 3.5 (A) 0.6 - 3.4   POC LYMPH PERCENT 33.1 10 - 50 %L   MID (cbc) 0.9 0 - 0.9   POC MID % 8.4 0 - 12 %M   POC Granulocyte 6.3 2 - 6.9   Granulocyte percent 58.5 37 - 80 %G   RBC 5.05 4.04 - 5.48 M/uL   Hemoglobin 15.3  12.2 - 16.2 g/dL   HCT, POC 43.4 37.7 - 47.9 %   MCV 85.8 80 - 97 fL   MCH, POC 30.4 27 - 31.2 pg   MCHC 35.4 31.8 - 35.4 g/dL   RDW, POC 13.4 %   Platelet Count, POC 276 142 - 424 K/uL   MPV 7.8 0 - 99.8 fL   Dg Chest 2 View  08/22/2015  CLINICAL DATA:  Cough than right rales for 5 weeks. EXAM: CHEST  2 VIEW COMPARISON:  Aug 12, 2012 FINDINGS: The heart size and mediastinal contours are within normal limits. There is no focal infiltrate, pulmonary edema, or pleural effusion. The visualized skeletal structures are unremarkable. IMPRESSION: No active cardiopulmonary disease. Electronically  Signed   By: Abelardo Diesel M.D.   On: 08/22/2015 15:28    Assessment & Plan:   1. Cough   2. Abdominal distension, gaseous   3. External hemorrhoids without complication   4. Cyst of Bartholin's gland   Pt upset that she had to come back here to be reassessed after the first time.  She is self-pay and so feels limited in the work-up she will consent to and the treatments she can afford. Never treat the alb inhaler due to cost. We discussed that her cough may be an acute illness os ok to try zpack but most common causes of cough tend to be silent LRD/GERD, post-nasal drip with seasonal allergies/UACS, and adult-onset cough variant asthma. If cough doesn't resolve with zpack, would rec starting nasal steroid with oral antihistamine and ppi x 6 wks.    Orders Placed This Encounter  Procedures  . DG Chest 2 View    Standing Status: Future     Number of Occurrences: 1     Standing Expiration Date: 08/21/2016    Order Specific Question:  Reason for Exam (SYMPTOM  OR DIAGNOSIS REQUIRED)    Answer:  RLL rales, cough x 5 wks, unresponsive to prednisone, reucrrent illnesses for months,    Order Specific Question:  Is the patient pregnant?    Answer:  No    Order Specific Question:  Preferred imaging location?    Answer:  External  . Comprehensive metabolic panel  . POCT CBC  . POCT SEDIMENTATION RATE  .  POC Hemoccult Bld/Stl (3-Cd Home Screen)    Standing Status: Future     Number of Occurrences:      Standing Expiration Date: 08/21/2016    Meds ordered this encounter  Medications  . azithromycin (ZITHROMAX) 250 MG tablet    Sig: Take 2 tabs PO x 1 dose, then 1 tab PO QD x 4 days    Dispense:  6 tablet    Refill:  0    I personally performed the services described in this documentation, which was scribed in my presence. The recorded information has been reviewed and considered, and addended by me as needed.  Delman Cheadle, MD MPH  Results for orders placed or performed in visit on 08/22/15  POCT CBC  Result Value Ref Range   WBC 10.7 (A) 4.6 - 10.2 K/uL   Lymph, poc 3.5 (A) 0.6 - 3.4   POC LYMPH PERCENT 33.1 10 - 50 %L   MID (cbc) 0.9 0 - 0.9   POC MID % 8.4 0 - 12 %M   POC Granulocyte 6.3 2 - 6.9   Granulocyte percent 58.5 37 - 80 %G   RBC 5.05 4.04 - 5.48 M/uL   Hemoglobin 15.3 12.2 - 16.2 g/dL   HCT, POC 43.4 37.7 - 47.9 %   MCV 85.8 80 - 97 fL   MCH, POC 30.4 27 - 31.2 pg   MCHC 35.4 31.8 - 35.4 g/dL   RDW, POC 13.4 %   Platelet Count, POC 276 142 - 424 K/uL   MPV 7.8 0 - 99.8 fL  POCT SEDIMENTATION RATE  Result Value Ref Range   POCT SED RATE 14 0 - 22 mm/hr

## 2015-08-22 NOTE — Patient Instructions (Addendum)
IF you received an x-ray today, you will receive an invoice from [facility]. Please contact [facility] at [phone] with questions or concerns regarding your invoice.   IF you received labwork today, you will receive an invoice from [employer]. Please contact [facility] at [phone] with questions or concerns regarding your invoice.   Our billing staff will not be able to assist you with questions regarding bills from these companies.  You will be contacted with the lab results as soon as they are available. The fastest way to get your results is to activate your My Chart account. Instructions are located on the last page of this paperwork. If you have not heard from [place] regarding the results in 2 weeks, please contact this office.     Bartholin Cyst or Abscess A Bartholin cyst is a fluid-filled sac that forms on a Bartholin gland. Bartholin glands are small glands that are located within the folds of skin (labia) along the sides of the lower opening of the vagina. These glands produce a fluid to moisten the outside of the vagina during sexual intercourse. A Bartholin cyst causes a bulge on the side of the vagina. A cyst that is not large or infected may not cause symptoms or problems. However, if the fluid within the cyst becomes infected, the cyst can turn into an abscess. An abscess may cause discomfort or pain. CAUSES A Bartholin cyst may develop when the duct of the gland becomes blocked. In many cases, the cause of this is not known. Various kinds of bacteria can cause the cyst to become infected and develop into an abscess. RISK FACTORS You may be at an increased risk of developing a Bartholin cyst or abscess if:  You are a woman of reproductive age.  You have a history of previous Bartholin cysts or abscesses.  You have diabetes.  You have a sexually transmitted disease (STD). SIGNS AND SYMPTOMS The severity of symptoms  varies depending on the size of the cyst and whether it is infected. Symptoms may include:  A bulge or swelling near the lower opening of your vagina.  Discomfort or pain.  Redness.  Pain during sexual intercourse.  Pain when walking.  Fluid draining from the area. DIAGNOSIS Your health care provider may make a diagnosis based on your symptoms and a physical exam. He or she will look for swelling in your vaginal area. Blood tests may be done to check for infections. A sample of fluid from the cyst or abscess may also be taken to be tested in a lab. TREATMENT Small cysts that are not infected may not require any treatment. These often go away on their own. Yourhealth care provider will recommend hot baths and the use of warm compresses. These may also be part of the treatment for an abscess. Treatment options for a large cyst or abscess may include:   Antibiotic medicine.  A surgical procedure to drain the abscess. One of the following procedures may be done:  Incision and drainage. An incision is made in the cyst or abscess so that the fluid drains out. A catheter may be placed inside the cyst so that it does not close and fill up with fluid again. The catheter will be removed after you have a follow-up visit with a specialist (gynecologist).  Marsupialization. The cyst or abscess is opened and kept open by stitching the edges of the skin to the walls of the cyst or abscess. This allows it  to continue to drain and not fill up with fluid again. If you have cysts or abscesses that keep returning and have required incision and drainage multiple times, your health care provider may talk to you about surgery to remove the Bartholin gland. HOME CARE INSTRUCTIONS  Take medicines only as directed by your health care provider.  If you were prescribed an antibiotic medicine, finish it all even if you start to feel better.  Apply warm, wet compresses to the area or take warm, shallow baths  that cover your pelvic region (sitz baths) several times a day or as directed by your health care provider.  Do not squeeze the cyst or apply heavy pressure to it.  Do not have sexual intercourse until the cyst has gone away.  If your cyst or abscess was opened, a small piece of gauze or a drain may have been placed in the area to allow drainage. Do not remove the gauze or the drain until directed by your health care provider.  Wear feminine pads--not tampons--as needed for any drainage or bleeding.  Keep all follow-up visits as directed by your health care provider. This is important. PREVENTION Take these steps to help prevent a Bartholin cyst from returning:  Practice good hygiene.   Clean your vaginal area with mild soap and a soft cloth when you bathe.  Practice safe sex to prevent STDs. SEEK MEDICAL CARE IF:  You have increased pain, swelling, or redness in the area of the cyst.  Puslike drainage is coming from the cyst.  You have a fever.   This information is not intended to replace advice given to you by your health care provider. Make sure you discuss any questions you have with your health care provider.   Document Released: 03/25/2005 Document Revised: 04/15/2014 Document Reviewed: 11/08/2013 Elsevier Interactive Patient Education 2016 Elsevier Inc. Cough, Adult Coughing is a reflex that clears your throat and your airways. Coughing helps to heal and protect your lungs. It is normal to cough occasionally, but a cough that happens with other symptoms or lasts a long time may be a sign of a condition that needs treatment. A cough may last only 2-3 weeks (acute), or it may last longer than 8 weeks (chronic). CAUSES Coughing is commonly caused by:  Breathing in substances that irritate your lungs.  A viral or bacterial respiratory infection.  Allergies.  Asthma.  Postnasal drip.  Smoking.  Acid backing up from the stomach into the esophagus (gastroesophageal  reflux).  Certain medicines.  Chronic lung problems, including COPD (or rarely, lung cancer).  Other medical conditions such as heart failure. HOME CARE INSTRUCTIONS  Pay attention to any changes in your symptoms. Take these actions to help with your discomfort:  Take medicines only as told by your health care provider.  If you were prescribed an antibiotic medicine, take it as told by your health care provider. Do not stop taking the antibiotic even if you start to feel better.  Talk with your health care provider before you take a cough suppressant medicine.  Drink enough fluid to keep your urine clear or pale yellow.  If the air is dry, use a cold steam vaporizer or humidifier in your bedroom or your home to help loosen secretions.  Avoid anything that causes you to cough at work or at home.  If your cough is worse at night, try sleeping in a semi-upright position.  Avoid cigarette smoke. If you smoke, quit smoking. If you need help quitting,  ask your health care provider.  Avoid caffeine.  Avoid alcohol.  Rest as needed. SEEK MEDICAL CARE IF:   You have new symptoms.  You cough up pus.  Your cough does not get better after 2-3 weeks, or your cough gets worse.  You cannot control your cough with suppressant medicines and you are losing sleep.  You develop pain that is getting worse or pain that is not controlled with pain medicines.  You have a fever.  You have unexplained weight loss.  You have night sweats. SEEK IMMEDIATE MEDICAL CARE IF:  You cough up blood.  You have difficulty breathing.  Your heartbeat is very fast.   This information is not intended to replace advice given to you by your health care provider. Make sure you discuss any questions you have with your health care provider.   Document Released: 09/21/2010 Document Revised: 12/14/2014 Document Reviewed: 06/01/2014 Elsevier Interactive Patient Education [employer].

## 2015-09-05 ENCOUNTER — Telehealth: Payer: Self-pay

## 2015-09-05 NOTE — Telephone Encounter (Signed)
Pt would like a call back from dr Brigitte Pulse only

## 2015-09-05 NOTE — Telephone Encounter (Signed)
Patient has been seen twice for her cough and none of the medications are working. Patient would like advising. [phone]

## 2015-09-05 NOTE — Telephone Encounter (Signed)
Am providing pt care tomorrow from 8-2. i will try to give her a call after my shift is over wed  5/31

## 2015-09-07 MED ORDER — AZITHROMYCIN 250 MG PO TABS
ORAL_TABLET | ORAL | Status: AC
Start: 2015-09-07 — End: ?

## 2015-09-07 NOTE — Telephone Encounter (Signed)
Called pt and she reports that she took the zpack and had immense improvement but she is still sick.  She noticed a huge difference within 24 hrs and she continued to improve.  It has now been 2 weeks since she started the zpack and is feeling like she did when the illness started. She REALLY wants another zpack.  She thinks she might have developed adult onset asthma but was unable to afford the albuterol - she might do a trial of it now. Might come in for pre and post bronchodialtor spirometry,. GERD resolved since trippling probiotics and starting Kambucha.

## 2016-01-05 ENCOUNTER — Other Ambulatory Visit: Payer: Self-pay | Admitting: Neurology

## 2016-01-05 NOTE — Telephone Encounter (Signed)
Attempted to call pt and her VM not set up.

## 2016-01-05 NOTE — Telephone Encounter (Signed)
That is fine, Lovey Newcomer can you approve this for 6 months? thanks

## 2016-01-05 NOTE — Telephone Encounter (Signed)
Pt called in requesting gabapentin (NEURONTIN) 100 MG capsule refill. Pt states she has a few refills left but is moving and is wanting to have another rx for 12 months. Pt was advised she will need to come in for an appt. Pt stated she has at least 8 refills left . Pt is moving to [place]. Please call and advise [phone] Pt does not want to come in as she does not have any insurance and is moving. States she will only be in town another 5 more days. She says she will not be seeing another neurologist when she moves. States her situation will not allow it.

## 2016-01-08 NOTE — Telephone Encounter (Signed)
Called and spoke to family member to have pt call.  Needing I believe written script for 12 month, Dr.Ahern ok for 6 months.

## 2016-01-11 NOTE — Telephone Encounter (Signed)
Called and LMVM for pt that had attempted to call her several times.   If still needs [place] to call us back.

## 2016-01-17 NOTE — Telephone Encounter (Signed)
Lindsay Barnes- here the the phone note we spoke about, thank you!

## 2016-01-17 NOTE — Telephone Encounter (Signed)
Attempted to call land line.  Rang and rang, then cut off.  Will attempt later.

## 2016-01-17 NOTE — Telephone Encounter (Signed)
Pt called back, msg relayed she is requesting paper RX to be sent to address: [address]  [place]  [address]. Pt has some questions, please call @ [phone] (mother's land line)

## 2016-01-18 MED ORDER — GABAPENTIN 100 MG PO CAPS: 100.0000 mg | ORAL_CAPSULE | Freq: Three times a day (TID) | ORAL | 6 refills | Status: AC

## 2016-01-18 NOTE — Telephone Encounter (Signed)
Spoke to pt this am.  She has moved now to [place], in with her mother (who needs her help) but also pt has had a hard time getting job and does not have insurance.  She had her gabapentin prescription transferred from [facility] in [place] to the one in [place], [place].  Her prescription will only be good until end of this year.  She is asking for another prescription (one yr).  I told her that Dr. Jaynee Eagles ok'd for 6 months.  Will escribe this prescription to [place], [place], [place].  I told pt she will need to get local neurologist there to refill, as our office will not refill after this prescription.  She asked about mailing prescription, and I was unsure we could do this and after conferring with other RN, no we do not do this.

## 2016-01-18 NOTE — Addendum Note (Signed)
Addended byOliver Hum on: 01/18/2016 02:10 PM   Modules accepted: Orders

## 2016-01-19 NOTE — Telephone Encounter (Signed)
Fax confirmation received [phone] for gabapentin.

## 2016-11-21 IMAGING — CR DG CHEST 2V
2 series · 2 of 2 positions shown · non-contrast
Comparison: August 12, 2012

CLINICAL DATA: Cough than right rales for 5 weeks.

EXAM:
CHEST  2 VIEW

[PA]
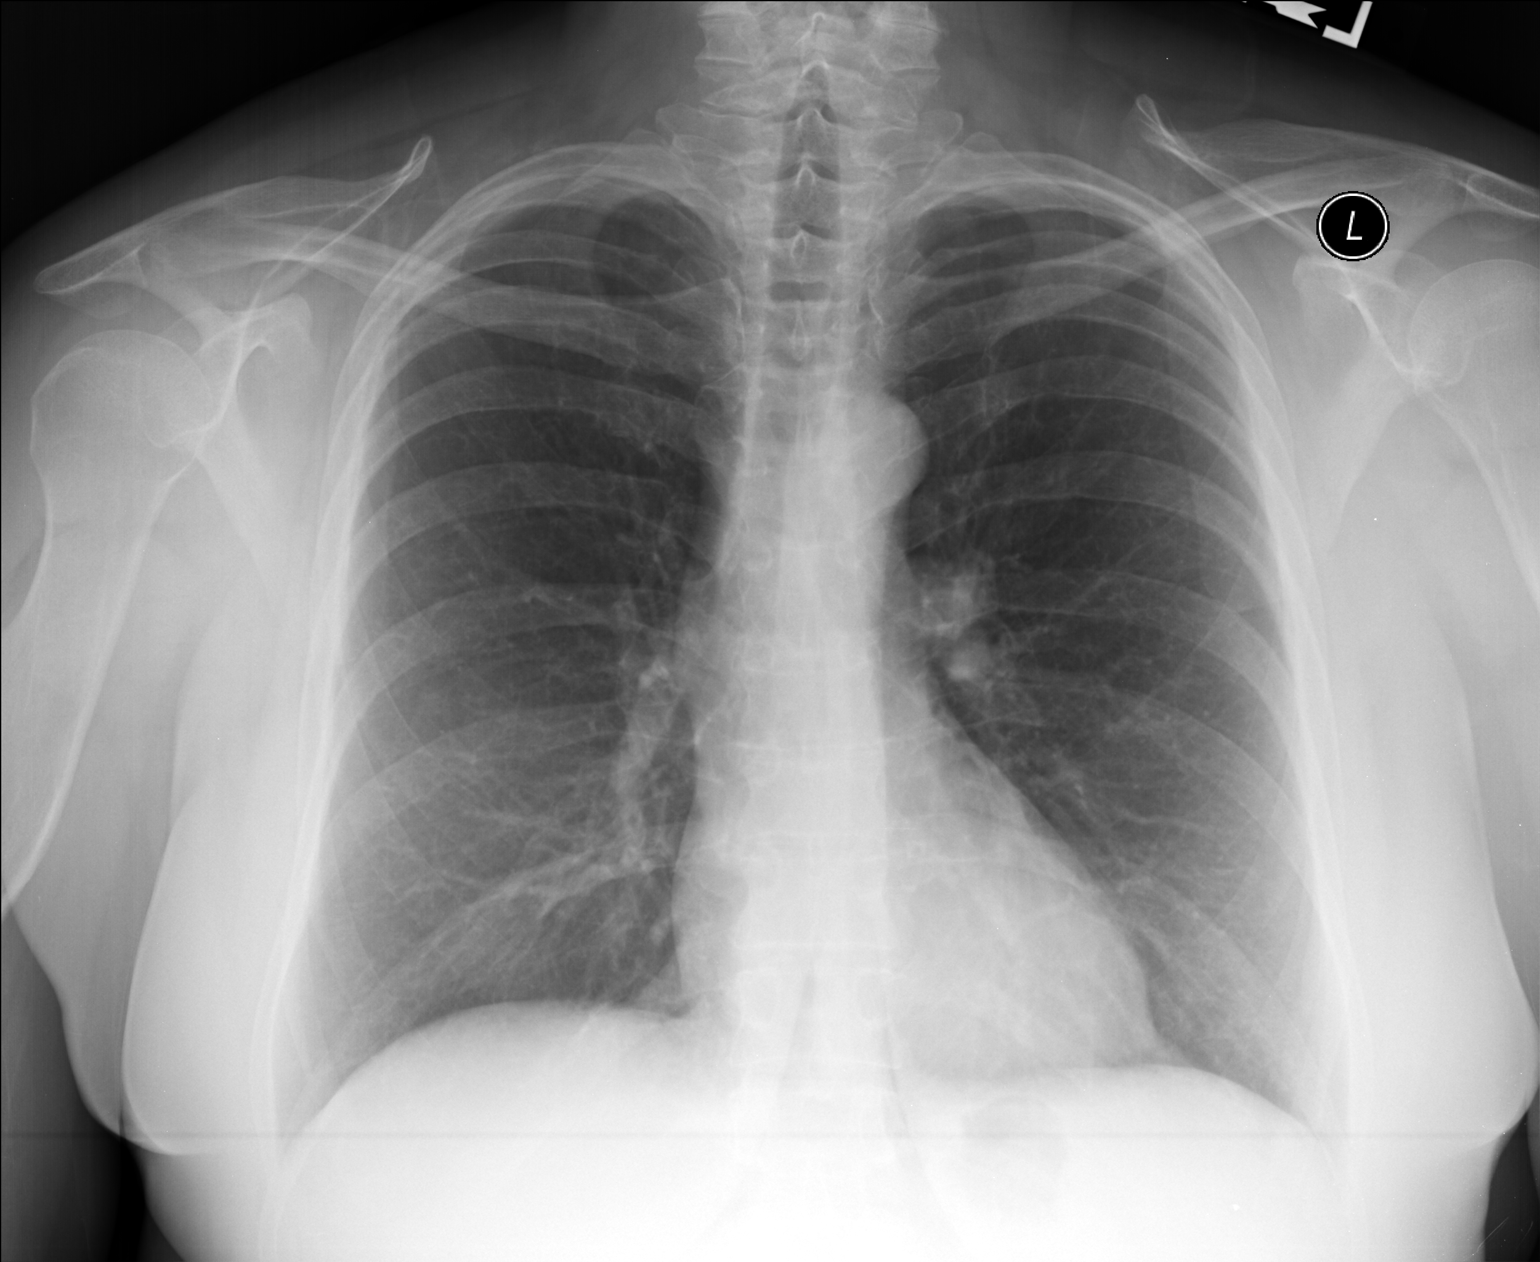

[lateral]
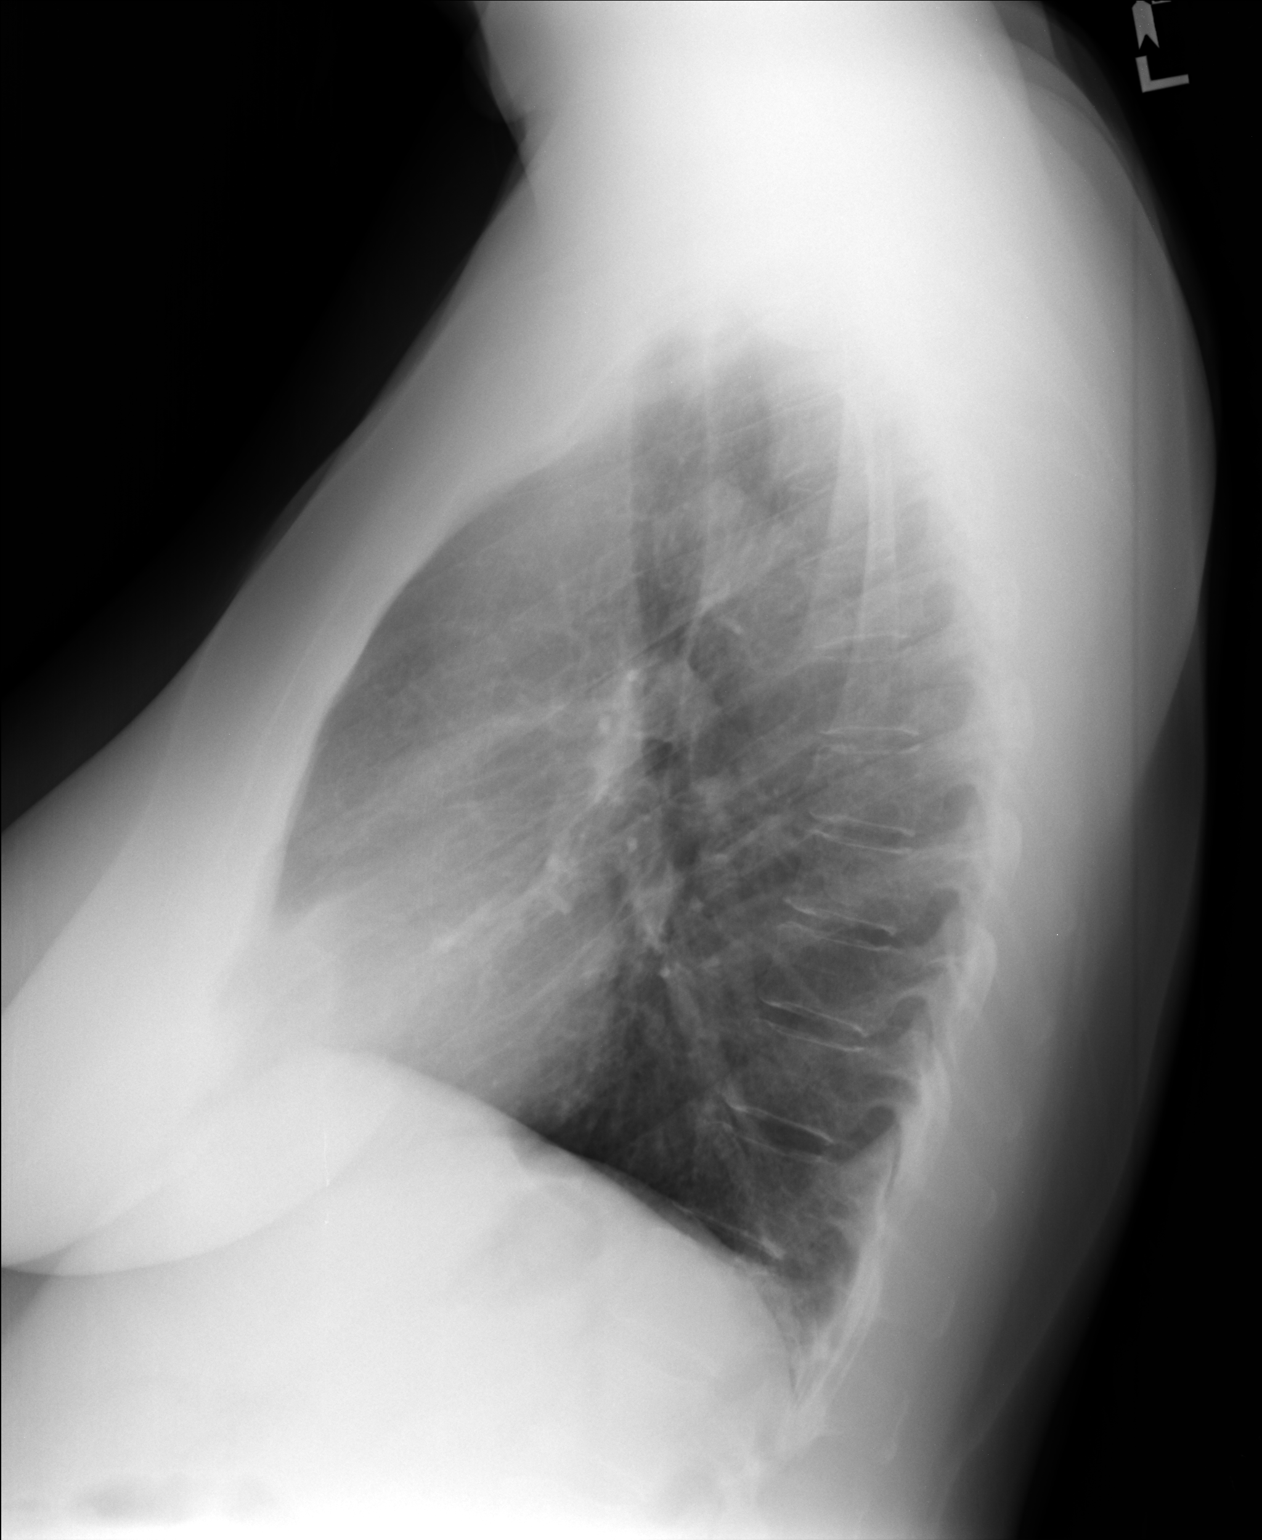

[2 of 2 positions shown; findings below may reference images not displayed]

FINDINGS: The heart size and mediastinal contours are within normal limits.
There is no focal infiltrate, pulmonary edema, or pleural effusion.
The visualized skeletal structures are unremarkable.
IMPRESSION: No active cardiopulmonary disease.

## 2017-07-14 ENCOUNTER — Encounter: Payer: Self-pay | Admitting: Physician Assistant
